# Patient Record
Sex: Female | Born: 1975 | Race: White | Hispanic: No | Marital: Married | Smoking: Never smoker
Health system: Southern US, Community
[De-identification: ages and names within clinical notes are randomized; demographics above are authoritative.]

## PROBLEM LIST (undated history)

## (undated) DIAGNOSIS — Z973 Presence of spectacles and contact lenses: Secondary | ICD-10-CM

## (undated) DIAGNOSIS — F329 Major depressive disorder, single episode, unspecified: Secondary | ICD-10-CM

## (undated) DIAGNOSIS — F419 Anxiety disorder, unspecified: Secondary | ICD-10-CM

## (undated) DIAGNOSIS — F411 Generalized anxiety disorder: Secondary | ICD-10-CM

## (undated) DIAGNOSIS — K219 Gastro-esophageal reflux disease without esophagitis: Secondary | ICD-10-CM

## (undated) HISTORY — PX: OVARIAN CYST REMOVAL: SHX89

## (undated) HISTORY — PX: OTHER SURGICAL HISTORY: SHX169

## (undated) HISTORY — PX: DILATION AND CURETTAGE OF UTERUS: SHX78

## (undated) HISTORY — PX: STRABISMUS SURGERY: SHX218

## (undated) HISTORY — PX: LEEP: SHX91

---

## 2004-08-18 ENCOUNTER — Other Ambulatory Visit: Admission: RE | Admit: 2004-08-18 | Discharge: 2004-08-18 | Payer: Self-pay | Admitting: Obstetrics and Gynecology

## 2004-12-18 HISTORY — PX: MICROTUBOPLASTY: SHX5401

## 2005-03-04 ENCOUNTER — Inpatient Hospital Stay (HOSPITAL_COMMUNITY): Admission: AD | Admit: 2005-03-04 | Discharge: 2005-03-04 | Payer: Self-pay | Admitting: Obstetrics and Gynecology

## 2005-04-05 ENCOUNTER — Other Ambulatory Visit: Admission: RE | Admit: 2005-04-05 | Discharge: 2005-04-05 | Payer: Self-pay | Admitting: Obstetrics and Gynecology

## 2005-04-09 ENCOUNTER — Encounter: Admission: RE | Admit: 2005-04-09 | Discharge: 2005-04-09 | Payer: Self-pay | Admitting: Obstetrics and Gynecology

## 2005-08-04 ENCOUNTER — Ambulatory Visit (HOSPITAL_COMMUNITY): Admission: RE | Admit: 2005-08-04 | Discharge: 2005-08-04 | Payer: Self-pay | Admitting: Obstetrics and Gynecology

## 2005-10-12 ENCOUNTER — Inpatient Hospital Stay (HOSPITAL_COMMUNITY): Admission: AD | Admit: 2005-10-12 | Discharge: 2005-10-14 | Payer: Self-pay | Admitting: Obstetrics and Gynecology

## 2005-10-20 ENCOUNTER — Ambulatory Visit (HOSPITAL_COMMUNITY): Admission: AD | Admit: 2005-10-20 | Discharge: 2005-10-20 | Payer: Self-pay | Admitting: Obstetrics and Gynecology

## 2010-02-08 ENCOUNTER — Encounter: Payer: Self-pay | Admitting: Family Medicine

## 2011-03-18 ENCOUNTER — Ambulatory Visit: Payer: Self-pay | Admitting: Family Medicine

## 2012-01-19 DIAGNOSIS — Z87410 Personal history of cervical dysplasia: Secondary | ICD-10-CM

## 2012-01-19 HISTORY — DX: Personal history of cervical dysplasia: Z87.410

## 2012-04-20 ENCOUNTER — Other Ambulatory Visit: Payer: Self-pay | Admitting: Obstetrics and Gynecology

## 2012-05-02 ENCOUNTER — Other Ambulatory Visit: Payer: Self-pay

## 2012-05-08 ENCOUNTER — Other Ambulatory Visit: Payer: Self-pay

## 2012-05-08 ENCOUNTER — Other Ambulatory Visit: Payer: Self-pay | Admitting: Obstetrics and Gynecology

## 2012-05-12 ENCOUNTER — Inpatient Hospital Stay (HOSPITAL_COMMUNITY)
Admission: AD | Admit: 2012-05-12 | Discharge: 2012-05-12 | Disposition: A | Discharge status: Home / Self Care | Payer: BC Managed Care – PPO | Source: Ambulatory Visit | Attending: Obstetrics and Gynecology | Admitting: Obstetrics and Gynecology

## 2012-05-12 ENCOUNTER — Encounter (HOSPITAL_COMMUNITY): Payer: Self-pay

## 2012-05-12 DIAGNOSIS — R109 Unspecified abdominal pain: Secondary | ICD-10-CM | POA: Insufficient documentation

## 2012-05-12 DIAGNOSIS — N938 Other specified abnormal uterine and vaginal bleeding: Secondary | ICD-10-CM | POA: Insufficient documentation

## 2012-05-12 DIAGNOSIS — Z3202 Encounter for pregnancy test, result negative: Secondary | ICD-10-CM | POA: Insufficient documentation

## 2012-05-12 DIAGNOSIS — N949 Unspecified condition associated with female genital organs and menstrual cycle: Secondary | ICD-10-CM | POA: Insufficient documentation

## 2012-05-12 DIAGNOSIS — N920 Excessive and frequent menstruation with regular cycle: Secondary | ICD-10-CM

## 2012-05-12 LAB — CBC
HCT: 36.9 % (ref 36.0–46.0)
Hemoglobin: 12.8 g/dL (ref 12.0–15.0)
MCHC: 34.7 g/dL (ref 30.0–36.0)
MCV: 91.3 fL (ref 78.0–100.0)
RDW: 12.3 % (ref 11.5–15.5)
WBC: 10.8 10*3/uL — ABNORMAL HIGH (ref 4.0–10.5)

## 2012-05-12 NOTE — MAU Provider Note (Signed)
History     CSN: [id number]  Arrival date and time: 05/12/12 1559   First Provider Initiated Contact with Patient 05/12/12 1739      Chief Complaint  Patient presents with  . Vaginal Bleeding  . Abdominal Pain   HPI 37 y.o. [id number] here with vaginal bleeding starting this morning. States bleeding started this morning, initially heavy with clots, has since slowed. Had a pain earlier when passing a clot, no pain now. Had Mirena removed on 4-2. Had colposcopy on Monday of this week, has LEEP planned. Last intercourse yesterday.   History reviewed. No pertinent past medical history.  Past Surgical History  Procedure Laterality Date  . Tubal reversal    . Dilation and curettage of uterus      No family history on file.  History  Substance Use Topics  . Smoking status: Never Smoker   . Smokeless tobacco: Not on file  . Alcohol Use: No    Allergies:  Allergies  Allergen Reactions  . Flagyl (Metronidazole) Hives and Nausea Only    Prescriptions prior to admission  Medication Sig Dispense Refill  . ibuprofen (ADVIL,MOTRIN) 200 MG tablet Take 600 mg by mouth every 6 (six) hours as needed for pain or headache.      . Prenatal Vit-Fe Fumarate-FA (PRENATAL MULTIVITAMIN) TABS Take 1 tablet by mouth daily at 12 noon.      . sertraline (ZOLOFT) 100 MG tablet Take 100 mg by mouth daily.        Review of Systems  Constitutional: Negative.   Respiratory: Negative.   Cardiovascular: Negative.   Gastrointestinal: Negative for nausea, vomiting, abdominal pain, diarrhea and constipation.  Genitourinary: Negative for dysuria, urgency, frequency, hematuria and flank pain.       Negative for  vaginal discharge, dyspareunia, + discharge   Musculoskeletal: Negative.   Neurological: Negative.   Psychiatric/Behavioral: Negative.    Physical Exam   Blood pressure 114/74, pulse 78, resp. rate 16, height 5\' 2"  (1.575 m), weight 140 lb (63.504 kg), SpO2 100.00%.  Physical Exam  Nursing  note and vitals reviewed. Constitutional: She is oriented to person, place, and time. She appears well-developed and well-nourished. No distress.  Cardiovascular: Normal rate.   Respiratory: Effort normal.  GI: Soft. There is no tenderness.  Genitourinary: There is no rash, tenderness or lesion on the right labia. There is no rash, tenderness or lesion on the left labia. Uterus is not enlarged and not tender. Cervix exhibits no motion tenderness, no discharge and no friability. Right adnexum displays no mass, no tenderness and no fullness. Left adnexum displays no mass, no tenderness and no fullness. There is bleeding (small) around the vagina.    Musculoskeletal: Normal range of motion.  Neurological: She is alert and oriented to person, place, and time.  Skin: Skin is warm and dry.  Psychiatric: She has a normal mood and affect.    MAU Course  Procedures  Results for orders placed during the hospital encounter of 05/12/12 (from the past 24 hour(s))  CBC     Status: Abnormal   Collection Time    05/12/12  4:41 PM      Result Value Range   WBC 10.8 (*) 4.0 - 10.5 K/uL   RBC 4.04  3.87 - 5.11 MIL/uL   Hemoglobin 12.8  12.0 - 15.0 g/dL   HCT 14.7  82.9 - 56.2 %   MCV 91.3  78.0 - 100.0 fL   MCH 31.7  26.0 - 34.0 pg  MCHC 34.7  30.0 - 36.0 g/dL   RDW 16.1  09.6 - 04.5 %   Platelets 202  150 - 400 K/uL  POCT PREGNANCY, URINE     Status: None   Collection Time    05/12/12  5:30 PM      Result Value Range   Preg Test, Ur NEGATIVE  NEGATIVE     Assessment and Plan  Vaginal bleeding - likely first menses after d/c of Mirena No active heavy bleeding - pt to call office if heavy bleeding resumes to discuss rx for progestin Precautions rev'd  FRAZIER,NATALIE 05/12/2012, 5:44 PM

## 2012-05-12 NOTE — MAU Note (Signed)
Patient states she had a Mirena taken out on 4-2. Had a colposcopy on 4-21 and started bleeding this morning. Has had some cramping and then will pass large clots.

## 2012-05-25 ENCOUNTER — Other Ambulatory Visit: Payer: Self-pay | Admitting: Obstetrics and Gynecology

## 2012-05-25 HISTORY — PX: CERVICAL BIOPSY  W/ LOOP ELECTRODE EXCISION: SUR135

## 2012-12-01 ENCOUNTER — Other Ambulatory Visit: Payer: Self-pay | Admitting: Obstetrics and Gynecology

## 2012-12-01 HISTORY — PX: LAPAROSCOPIC OVARIAN CYSTECTOMY: SUR786

## 2013-02-24 ENCOUNTER — Inpatient Hospital Stay (HOSPITAL_COMMUNITY)
Admission: AD | Admit: 2013-02-24 | Discharge: 2013-02-24 | Disposition: A | Discharge status: Home / Self Care | Payer: BC Managed Care – PPO | Source: Ambulatory Visit | Attending: Obstetrics and Gynecology | Admitting: Obstetrics and Gynecology

## 2013-02-24 ENCOUNTER — Inpatient Hospital Stay (HOSPITAL_COMMUNITY): Payer: BC Managed Care – PPO

## 2013-02-24 ENCOUNTER — Encounter (HOSPITAL_COMMUNITY): Payer: Self-pay | Admitting: *Deleted

## 2013-02-24 DIAGNOSIS — O209 Hemorrhage in early pregnancy, unspecified: Secondary | ICD-10-CM | POA: Insufficient documentation

## 2013-02-24 DIAGNOSIS — Z6791 Unspecified blood type, Rh negative: Secondary | ICD-10-CM | POA: Diagnosis present

## 2013-02-24 DIAGNOSIS — F411 Generalized anxiety disorder: Secondary | ICD-10-CM | POA: Insufficient documentation

## 2013-02-24 DIAGNOSIS — O200 Threatened abortion: Secondary | ICD-10-CM

## 2013-02-24 HISTORY — DX: Anxiety disorder, unspecified: F41.9

## 2013-02-24 LAB — CBC
HCT: 34.1 % — ABNORMAL LOW (ref 36.0–46.0)
Hemoglobin: 11.8 g/dL — ABNORMAL LOW (ref 12.0–15.0)
MCH: 30.9 pg (ref 26.0–34.0)
MCHC: 34.6 g/dL (ref 30.0–36.0)
MCV: 89.3 fL (ref 78.0–100.0)
PLATELETS: 215 10*3/uL (ref 150–400)
RBC: 3.82 MIL/uL — ABNORMAL LOW (ref 3.87–5.11)
RDW: 12.7 % (ref 11.5–15.5)
WBC: 11.4 10*3/uL — ABNORMAL HIGH (ref 4.0–10.5)

## 2013-02-24 LAB — URINE MICROSCOPIC-ADD ON

## 2013-02-24 LAB — URINALYSIS, ROUTINE W REFLEX MICROSCOPIC
BILIRUBIN URINE: NEGATIVE
Glucose, UA: NEGATIVE mg/dL
KETONES UR: NEGATIVE mg/dL
LEUKOCYTES UA: NEGATIVE
NITRITE: NEGATIVE
Protein, ur: NEGATIVE mg/dL
SPECIFIC GRAVITY, URINE: 1.02 (ref 1.005–1.030)
UROBILINOGEN UA: 0.2 mg/dL (ref 0.0–1.0)
pH: 6 (ref 5.0–8.0)

## 2013-02-24 LAB — WET PREP, GENITAL
CLUE CELLS WET PREP: NONE SEEN
Trich, Wet Prep: NONE SEEN
Yeast Wet Prep HPF POC: NONE SEEN

## 2013-02-24 LAB — HCG, QUANTITATIVE, PREGNANCY: HCG, BETA CHAIN, QUANT, S: 59742 m[IU]/mL — AB (ref <5)

## 2013-02-24 LAB — ABO/RH: ABO/RH(D): O NEG

## 2013-02-24 MED ORDER — RHO D IMMUNE GLOBULIN 1500 UNIT/2ML IJ SOLN
300.0000 ug | Freq: Once | INTRAMUSCULAR | Status: AC
  Administered 2013-02-24: 300 ug via INTRAMUSCULAR
  Filled 2013-02-24: qty 2

## 2013-02-24 NOTE — Discharge Instructions (Signed)
Threatened Miscarriage  A threatened miscarriage is a pregnancy that may end. It may be marked by bleeding during the first 20 weeks of pregnancy. Often, the pregnancy can continue without any more problems. You may be asked to stop:  Having sex (intercourse).  Having orgasms.  Using tampons.  Exercising.  Doing heavy physical activity and work. HOME CARE   Your doctor may tell you to take bed rest and to stop activities and work.  Write down the number of pads you use each day. Write down how often you change pads. Write down how soaked they are.  Follow your doctor's advice for follow-up visits and tests.  If your blood type is Rh-negative and the father's blood is Rh-positive (or is not known), you may get a shot to protect the baby.  If you have a miscarriage, save all the tissue you pass in a container. Take the container to your doctor. GET HELP RIGHT AWAY IF:   You have bad cramps or pain in your belly (abdomen), lower belly, or back.  You have a fever or chills.  Your bleeding gets worse or you pass large clots of blood or tissue. Save this tissue to show your doctor.  You feel lightheaded, weak, dizzy, or pass out (faint).  You have a gush of fluid from your vagina. MAKE SURE YOU:   Understand these instructions.  Will watch your condition.  Will get help right away if you are not doing well or get worse. Document Released: 12/18/2007 Document Revised: 03/29/2011 Document Reviewed: 01/20/2009 [facility] Patient Information 2014 [place], [place].  Pelvic Rest Pelvic rest is sometimes recommended for women when:   The placenta is partially or completely covering the opening of the cervix (placenta previa).  There is bleeding between the uterine wall and the amniotic sac in the first trimester (subchorionic hemorrhage).  The cervix begins to open without labor starting (incompetent cervix, cervical insufficiency).  The labor is too early (preterm labor). HOME  CARE INSTRUCTIONS  Do not have sexual intercourse, stimulation, or an orgasm.  Do not use tampons, douche, or put anything in the vagina.  Do not lift anything over 10 pounds (4.5 kg).  Avoid strenuous activity or straining your pelvic muscles. SEEK MEDICAL CARE IF:  You have any vaginal bleeding during pregnancy. Treat this as a potential emergency.  You have cramping pain felt low in the stomach (stronger than menstrual cramps).  You notice vaginal discharge (watery, mucus, or bloody).  You have a low, dull backache.  There are regular contractions or uterine tightening. SEEK IMMEDIATE MEDICAL CARE IF: You have vaginal bleeding and have placenta previa.  Document Released: 05/01/2010 Document Revised: 03/29/2011 Document Reviewed: 05/01/2010 [facility] Patient Information 2014 [place], [place]. Rh Incompatibility Rh incompatibility is a condition that occurs during pregnancy if a woman has Rh-negative blood and her baby has Rh-positive blood. "Rh-negative" and "Rh-positive" refer to whether or not the blood has an Rh factor. An Rh factor is a specific protein found on the surface of red blood cells. If a woman has Rh factor, she is Rh-positive. If she does not have an Rh factor, she is Rh-negative. Having or not having an Rh factor does not affect the mother's general health. However, it can cause problems during pregnancy.  WHAT KIND OF PROBLEMS CAN Rh INCOMPATIBILITY CAUSE? During pregnancy, blood from the baby can cross into the mother's bloodstream, especially during delivery. If a mother is Rh-negative and the baby is Rh-positive, the mother's defense system will react  to the baby's blood as if it was a foreign substance and will create proteins (antibodies). This is called sensitization. Once the mother is sensitized, her Rh antibodies will cross the placenta to the baby and attack the baby's Rh-positive blood as if it is a harmful substance.  Rh incompatibility can also happen if  the Rh-negative pregnant woman is exposed to the Rh factor during a blood transfusion with Rh-positive blood.  HOW DOES THIS CONDITION AFFECT MY BABY? The Rh antibodies that attack and destroy the baby's red blood cells can lead to hemolytic disease in the baby. Hemolytic disease is when the red blood cells break down. This can cause:   Yellowing of the skin and eyes (jaundice).  The body to not have enough healthy red blood cells (anemia).   Brain damage.   Heart failure.   Death.  These antibodies usually do not cause problems during a first pregnancy. This is because the blood from the baby often times crosses into the mother's bloodstream during delivery, and the baby is born before many of the antibodies can develop. However, the antibodies stay in your body once they have formed. Because of this, Rh incompatibility is more likely to cause problems in second or later pregnancies (if the baby is Rh-positive).  HOW IS THIS CONDITION DIAGNOSED? When a woman becomes pregnant, blood tests may be done to find out her blood type and Rh factor. If the woman is Rh-negative, she also may have another blood test called an antibody screen. The antibody screen shows whether she has Rh antibodies in her blood. If she does, it means she was exposed to Rh-positive blood before, and she is at risk for Rh incompatibility.  To find out whether the baby is developing hemolytic anemia and how serious it is, caregivers may use more advanced tests, such as ultrasonography (commonly known as ultrasound).  HOW IS Rh INCOMPATIBILITY TREATED?  Rh incompatibility is treated with a shot of medicine called Rho(D) immune globulin. This medicine keeps the woman's body from making antibodies that can cause serious problems in the baby or future babies.  Two shots will be given, one at around your seventh month of pregnancy and the other within 72 hours of your baby being born. If you are Rh-negative, you will need this  medicine every time you have a baby with Rh-positive blood. If you already have antibodies in your blood, anti-D immune globulin will not help. Your doctor will not give you this medicine, but will watch your pregnancy closely for problems instead.  This shot may also be given to an Rh-negative woman when the risk of blood transfer between the mom and baby is high. The risk is high with:   An amniocentesis.   A miscarriage or an abortion.   An ectopic pregnancy.   Any vaginal bleeding during pregnancy.  Document Released: 06/26/2001 Document Revised: 09/06/2012 Document Reviewed: 04/18/2012 [facility] Patient Information 2014 [place], [place].

## 2013-02-24 NOTE — MAU Note (Signed)
Pt states here for bleeding when wiping noted since yesterday. Having mild pain. Had yest infection and MD called in rx for patient. Unsure if yeast inf. Is gone. Denies uti s/s.

## 2013-02-24 NOTE — MAU Note (Signed)
Pt. Here for spotting. Did have a yeast infection Wednesday and was treated for that. Thursday pt. Had pain in right side of abdomen. Did have spotting yesterday and again today. Noticed more when wiped.

## 2013-02-24 NOTE — MAU Provider Note (Signed)
History     CSN: [id number]  Arrival date and time: 02/24/13 1059   First Provider Initiated Contact with Patient 02/24/13 1336      Chief Complaint  Patient presents with  . Vaginal Bleeding   HPI Comments: Tanya Miller 38 y.o. [id number] presents to MAU with spotting and right sided abdominal pains for 2 days. She has not taken any medications.  She has intercourse yesterday. She was diagnosed with yeast infection few days ago and given cream. She is patient of Tanya Miller.   Vaginal Bleeding      Past Medical History  Diagnosis Date  . Anxiety     Past Surgical History  Procedure Laterality Date  . Tubal reversal    . Dilation and curettage of uterus      april 2014  . Dilation and curettage of uterus      sept 2014/had procedure on tubes    History reviewed. No pertinent family history.  History  Substance Use Topics  . Smoking status: Never Smoker   . Smokeless tobacco: Not on file  . Alcohol Use: No    Allergies:  Allergies  Allergen Reactions  . Flagyl [Metronidazole] Hives and Nausea Only    Prescriptions prior to admission  Medication Sig Dispense Refill  . docusate sodium (COLACE) 100 MG capsule Take 100 mg by mouth daily.      . Prenatal Vit-Fe Fumarate-FA (PRENATAL MULTIVITAMIN) TABS Take 1 tablet by mouth daily at 12 noon.      . sertraline (ZOLOFT) 100 MG tablet Take 100 mg by mouth daily.        Review of Systems  Constitutional: Negative.   HENT: Negative.   Eyes: Negative.   Respiratory: Negative.   Cardiovascular: Negative.   Gastrointestinal: Negative.   Genitourinary:       Vaginal bleeding  Musculoskeletal: Negative.   Skin: Negative.   Neurological: Negative.   Endo/Heme/Allergies: Negative.   Psychiatric/Behavioral: Negative.    Physical Exam   Blood pressure 103/68, pulse 73, temperature 98.4 F (36.9 C), temperature source Oral, resp. rate 16, height 5\' 3"  (1.6 m), weight 66.225 kg (146 lb), last menstrual period  12/26/2012, SpO2 100.00%.  Physical Exam  Constitutional: She is oriented to person, place, and time. She appears well-developed and well-nourished. No distress.  HENT:  Head: Normocephalic and atraumatic.  Eyes: Pupils are equal, round, and reactive to light.  Cardiovascular: Normal rate, regular rhythm and normal heart sounds.   Respiratory: Effort normal and breath sounds normal.  GI: Soft. Bowel sounds are normal.  Genitourinary:  Genital:external negative Vaginal:very small amount of blood streaked mucous Cervix:closed Bimanual:negative   Neurological: She is alert and oriented to person, place, and time.  Skin: Skin is warm and dry.  Psychiatric: She has a normal mood and affect. Her behavior is normal. Judgment and thought content normal.   Results for orders placed during the hospital encounter of 02/24/13 (from the past 24 hour(s))  URINALYSIS, ROUTINE W REFLEX MICROSCOPIC     Status: Abnormal   Collection Time    02/24/13 12:07 PM      Result Value Range   Color, Urine YELLOW  YELLOW   APPearance CLEAR  CLEAR   Specific Gravity, Urine 1.020  1.005 - 1.030   pH 6.0  5.0 - 8.0   Glucose, UA NEGATIVE  NEGATIVE mg/dL   Hgb urine dipstick TRACE (*) NEGATIVE   Bilirubin Urine NEGATIVE  NEGATIVE   Ketones, ur NEGATIVE  NEGATIVE mg/dL  Protein, ur NEGATIVE  NEGATIVE mg/dL   Urobilinogen, UA 0.2  0.0 - 1.0 mg/dL   Nitrite NEGATIVE  NEGATIVE   Leukocytes, UA NEGATIVE  NEGATIVE  URINE MICROSCOPIC-ADD ON     Status: Abnormal   Collection Time    02/24/13 12:07 PM      Result Value Range   Squamous Epithelial / LPF FEW (*) RARE   WBC, UA 0-2  <3 WBC/hpf   RBC / HPF 0-2  <3 RBC/hpf   Bacteria, UA FEW (*) RARE  CBC     Status: Abnormal   Collection Time    02/24/13 12:11 PM      Result Value Range   WBC 11.4 (*) 4.0 - 10.5 K/uL   RBC 3.82 (*) 3.87 - 5.11 MIL/uL   Hemoglobin 11.8 (*) 12.0 - 15.0 g/dL   HCT 47.834.1 (*) 29.536.0 - 62.146.0 %   MCV 89.3  78.0 - 100.0 fL   MCH 30.9   26.0 - 34.0 pg   MCHC 34.6  30.0 - 36.0 g/dL   RDW 30.812.7  65.711.5 - 84.615.5 %   Platelets 215  150 - 400 K/uL  ABO/RH     Status: None   Collection Time    02/24/13 12:11 PM      Result Value Range   ABO/RH(D) O NEG    HCG, QUANTITATIVE, PREGNANCY     Status: Abnormal   Collection Time    02/24/13 12:20 PM      Result Value Range   hCG, Beta Chain, Quant, [place] [id number] (*) <5 mIU/mL  WET PREP, GENITAL     Status: Abnormal   Collection Time    02/24/13  1:34 PM      Result Value Range   Yeast Wet Prep HPF POC NONE SEEN  NONE SEEN   Trich, Wet Prep NONE SEEN  NONE SEEN   Clue Cells Wet Prep HPF POC NONE SEEN  NONE SEEN   WBC, Wet Prep HPF POC FEW (*) NONE SEEN   [place] Ob Comp Less 14 Wks  02/24/2013   CLINICAL DATA:  Bleeding.  EXAM: OBSTETRIC <14 WK ULTRASOUND  TECHNIQUE: Transabdominal ultrasound was performed for evaluation of the gestation as well as the maternal uterus and adnexal regions.  COMPARISON:  None.  FINDINGS: Intrauterine gestational sac: Visualized/normal in shape.  Yolk sac:  Present  Embryo:  Present  Cardiac Activity: Present  Heart Rate: 174 bpm  CRL:   23  mm   9 w 0 d                  US EDC: 09/29/2013  Maternal uterus/adnexae: Right ovary measures 3.4 x 1.5 x 2.1 cm. Left ovary measures 2.5 x 1.7 x 2.2 cm. No evidence for free fluid.  IMPRESSION: Single live intrauterine pregnancy. Calculated gestational age is 9 weeks, 0 days.   Electronically Signed   By: Richarda OverlieAdam  Miller M.D.   On: 02/24/2013 13:28     MAU Course  Procedures  MDM  CBC, U/S, Quant, ABORh Wet Prep/ GC/Chlamydia Called Tanya Marcelle OverlieHolland with update on patient status  Assessment and Plan   A: Bleeding in early pregnancy Negative blood type/ Rhogam IM now Miscarriage precautions   Carolynn ServeBarefoot, Tanya Miller 02/24/2013, 2:49 PM

## 2013-02-25 LAB — RH IG WORKUP (INCLUDES ABO/RH)
ABO/RH(D): O NEG
Antibody Screen: NEGATIVE
GESTATIONAL AGE(WKS): 9
Unit division: 0

## 2013-02-27 LAB — OB RESULTS CONSOLE RUBELLA ANTIBODY, IGM: RUBELLA: IMMUNE

## 2013-02-27 LAB — OB RESULTS CONSOLE HIV ANTIBODY (ROUTINE TESTING): HIV: NONREACTIVE

## 2013-02-27 LAB — OB RESULTS CONSOLE HEPATITIS B SURFACE ANTIGEN: Hepatitis B Surface Ag: NEGATIVE

## 2013-02-27 LAB — OB RESULTS CONSOLE ABO/RH: RH Type: NEGATIVE

## 2013-02-27 LAB — OB RESULTS CONSOLE GC/CHLAMYDIA
CHLAMYDIA, DNA PROBE: NEGATIVE
GC PROBE AMP, GENITAL: NEGATIVE

## 2013-02-27 LAB — OB RESULTS CONSOLE RPR: RPR: NONREACTIVE

## 2013-03-01 ENCOUNTER — Inpatient Hospital Stay (HOSPITAL_COMMUNITY)
Admission: EM | Admit: 2013-03-01 | Discharge: 2013-03-01 | Disposition: A | Discharge status: Home / Self Care | Payer: BC Managed Care – PPO | Attending: Obstetrics and Gynecology | Admitting: Obstetrics and Gynecology

## 2013-08-18 ENCOUNTER — Inpatient Hospital Stay (HOSPITAL_COMMUNITY)
Admission: AD | Admit: 2013-08-18 | Payer: BC Managed Care – PPO | Source: Ambulatory Visit | Admitting: Obstetrics and Gynecology

## 2013-09-03 LAB — OB RESULTS CONSOLE GBS: STREP GROUP B AG: NEGATIVE

## 2013-09-21 ENCOUNTER — Inpatient Hospital Stay (HOSPITAL_COMMUNITY)
Admission: AD | Admit: 2013-09-21 | Discharge: 2013-09-22 | Disposition: A | Discharge status: Home / Self Care | Payer: BC Managed Care – PPO | Source: Ambulatory Visit | Attending: Obstetrics & Gynecology | Admitting: Obstetrics & Gynecology

## 2013-09-21 ENCOUNTER — Encounter (HOSPITAL_COMMUNITY): Payer: Self-pay | Admitting: *Deleted

## 2013-09-21 DIAGNOSIS — O479 False labor, unspecified: Secondary | ICD-10-CM | POA: Insufficient documentation

## 2013-09-21 NOTE — MAU Note (Signed)
Contractions all day. Stronger since 1900. 2-3cm and 80-90effaced on TUes. Denies LOF or bleeding

## 2013-09-21 NOTE — MAU Note (Signed)
PT  SAYS SHE STARTED  HURTING BAD AT 7PM.     VE  IN OFFICE  ON Tuesday-  2-3 CM.    DENIES HSV AND MRSA.     GBS- NEG.

## 2013-09-22 DIAGNOSIS — O479 False labor, unspecified: Secondary | ICD-10-CM | POA: Diagnosis not present

## 2013-09-25 ENCOUNTER — Inpatient Hospital Stay (HOSPITAL_COMMUNITY): Payer: BC Managed Care – PPO | Admitting: Anesthesiology

## 2013-09-25 ENCOUNTER — Encounter (HOSPITAL_COMMUNITY): Payer: Self-pay | Admitting: *Deleted

## 2013-09-25 ENCOUNTER — Encounter (HOSPITAL_COMMUNITY): Payer: BC Managed Care – PPO | Admitting: Anesthesiology

## 2013-09-25 ENCOUNTER — Inpatient Hospital Stay (HOSPITAL_COMMUNITY)
Admission: AD | Admit: 2013-09-25 | Discharge: 2013-09-27 | DRG: 775 | Disposition: A | Discharge status: Home / Self Care | Payer: BC Managed Care – PPO | Source: Ambulatory Visit | Attending: Obstetrics and Gynecology | Admitting: Obstetrics and Gynecology

## 2013-09-25 DIAGNOSIS — IMO0001 Reserved for inherently not codable concepts without codable children: Secondary | ICD-10-CM

## 2013-09-25 DIAGNOSIS — O09529 Supervision of elderly multigravida, unspecified trimester: Secondary | ICD-10-CM | POA: Diagnosis present

## 2013-09-25 DIAGNOSIS — O479 False labor, unspecified: Secondary | ICD-10-CM | POA: Diagnosis present

## 2013-09-25 LAB — CBC
HCT: 33.4 % — ABNORMAL LOW (ref 36.0–46.0)
Hemoglobin: 11.1 g/dL — ABNORMAL LOW (ref 12.0–15.0)
MCH: 30.7 pg (ref 26.0–34.0)
MCHC: 33.2 g/dL (ref 30.0–36.0)
MCV: 92.5 fL (ref 78.0–100.0)
Platelets: 177 10*3/uL (ref 150–400)
RBC: 3.61 MIL/uL — ABNORMAL LOW (ref 3.87–5.11)
RDW: 14.6 % (ref 11.5–15.5)
WBC: 12.2 10*3/uL — ABNORMAL HIGH (ref 4.0–10.5)

## 2013-09-25 LAB — RPR

## 2013-09-25 MED ORDER — FENTANYL 2.5 MCG/ML BUPIVACAINE 1/10 % EPIDURAL INFUSION (WH - ANES)
14.0000 mL/h | INTRAMUSCULAR | Status: DC | PRN
  Administered 2013-09-25: 14 mL/h via EPIDURAL

## 2013-09-25 MED ORDER — OXYTOCIN BOLUS FROM INFUSION: 500.0000 mL | INTRAVENOUS | Status: DC

## 2013-09-25 MED ORDER — OXYCODONE-ACETAMINOPHEN 5-325 MG PO TABS: 1.0000 | ORAL_TABLET | ORAL | Status: DC | PRN

## 2013-09-25 MED ORDER — EPHEDRINE 5 MG/ML INJ
10.0000 mg | INTRAVENOUS | Status: DC | PRN
  Filled 2013-09-25: qty 2

## 2013-09-25 MED ORDER — IBUPROFEN 600 MG PO TABS
600.0000 mg | ORAL_TABLET | Freq: Four times a day (QID) | ORAL | Status: DC
  Administered 2013-09-25 – 2013-09-27 (×8): 600 mg via ORAL
  Filled 2013-09-25 (×8): qty 1

## 2013-09-25 MED ORDER — ACETAMINOPHEN 325 MG PO TABS: 650.0000 mg | ORAL_TABLET | ORAL | Status: DC | PRN

## 2013-09-25 MED ORDER — WITCH HAZEL-GLYCERIN EX PADS: 1.0000 "application " | MEDICATED_PAD | CUTANEOUS | Status: DC | PRN

## 2013-09-25 MED ORDER — PHENYLEPHRINE 40 MCG/ML (10ML) SYRINGE FOR IV PUSH (FOR BLOOD PRESSURE SUPPORT)
80.0000 ug | PREFILLED_SYRINGE | INTRAVENOUS | Status: DC | PRN
  Filled 2013-09-25: qty 2

## 2013-09-25 MED ORDER — LACTATED RINGERS IV SOLN
INTRAVENOUS | Status: DC
  Administered 2013-09-25: 11:00:00 via INTRAVENOUS

## 2013-09-25 MED ORDER — LIDOCAINE HCL (PF) 1 % IJ SOLN
30.0000 mL | INTRAMUSCULAR | Status: DC | PRN
  Filled 2013-09-25: qty 30

## 2013-09-25 MED ORDER — DIPHENHYDRAMINE HCL 50 MG/ML IJ SOLN: 12.5000 mg | INTRAMUSCULAR | Status: DC | PRN

## 2013-09-25 MED ORDER — LANOLIN HYDROUS EX OINT: TOPICAL_OINTMENT | CUTANEOUS | Status: DC | PRN

## 2013-09-25 MED ORDER — FENTANYL 2.5 MCG/ML BUPIVACAINE 1/10 % EPIDURAL INFUSION (WH - ANES)
INTRAMUSCULAR | Status: AC
  Filled 2013-09-25: qty 125

## 2013-09-25 MED ORDER — ONDANSETRON HCL 4 MG/2ML IJ SOLN: 4.0000 mg | Freq: Four times a day (QID) | INTRAMUSCULAR | Status: DC | PRN

## 2013-09-25 MED ORDER — BISACODYL 10 MG RE SUPP: 10.0000 mg | Freq: Every day | RECTAL | Status: DC | PRN

## 2013-09-25 MED ORDER — DIBUCAINE 1 % RE OINT: 1.0000 "application " | TOPICAL_OINTMENT | RECTAL | Status: DC | PRN

## 2013-09-25 MED ORDER — OXYCODONE-ACETAMINOPHEN 5-325 MG PO TABS: 2.0000 | ORAL_TABLET | ORAL | Status: DC | PRN

## 2013-09-25 MED ORDER — FLEET ENEMA 7-19 GM/118ML RE ENEM: 1.0000 | ENEMA | Freq: Every day | RECTAL | Status: DC | PRN

## 2013-09-25 MED ORDER — SENNOSIDES-DOCUSATE SODIUM 8.6-50 MG PO TABS
2.0000 | ORAL_TABLET | ORAL | Status: DC
  Administered 2013-09-26 – 2013-09-27 (×2): 2 via ORAL
  Filled 2013-09-25 (×2): qty 2

## 2013-09-25 MED ORDER — SERTRALINE HCL 100 MG PO TABS
100.0000 mg | ORAL_TABLET | Freq: Every day | ORAL | Status: DC
  Administered 2013-09-26: 100 mg via ORAL
  Filled 2013-09-25 (×2): qty 1

## 2013-09-25 MED ORDER — OXYTOCIN 40 UNITS IN LACTATED RINGERS INFUSION - SIMPLE MED
62.5000 mL/h | INTRAVENOUS | Status: DC
  Administered 2013-09-25: 62.5 mL/h via INTRAVENOUS
  Filled 2013-09-25: qty 1000

## 2013-09-25 MED ORDER — CITRIC ACID-SODIUM CITRATE 334-500 MG/5ML PO SOLN: 30.0000 mL | ORAL | Status: DC | PRN

## 2013-09-25 MED ORDER — ONDANSETRON HCL 4 MG PO TABS: 4.0000 mg | ORAL_TABLET | ORAL | Status: DC | PRN

## 2013-09-25 MED ORDER — TETANUS-DIPHTH-ACELL PERTUSSIS 5-2.5-18.5 LF-MCG/0.5 IM SUSP
0.5000 mL | Freq: Once | INTRAMUSCULAR | Status: DC
Start: 2013-09-26 — End: 2013-09-27

## 2013-09-25 MED ORDER — ZOLPIDEM TARTRATE 5 MG PO TABS: 5.0000 mg | ORAL_TABLET | Freq: Every evening | ORAL | Status: DC | PRN

## 2013-09-25 MED ORDER — LACTATED RINGERS IV SOLN: 500.0000 mL | Freq: Once | INTRAVENOUS | Status: DC

## 2013-09-25 MED ORDER — BENZOCAINE-MENTHOL 20-0.5 % EX AERO
1.0000 "application " | INHALATION_SPRAY | CUTANEOUS | Status: DC | PRN
  Filled 2013-09-25: qty 56

## 2013-09-25 MED ORDER — LIDOCAINE HCL (PF) 1 % IJ SOLN
INTRAMUSCULAR | Status: DC | PRN
  Administered 2013-09-25 (×2): 8 mL

## 2013-09-25 MED ORDER — DIPHENHYDRAMINE HCL 25 MG PO CAPS: 25.0000 mg | ORAL_CAPSULE | Freq: Four times a day (QID) | ORAL | Status: DC | PRN

## 2013-09-25 MED ORDER — LACTATED RINGERS IV SOLN: 500.0000 mL | INTRAVENOUS | Status: DC | PRN

## 2013-09-25 MED ORDER — ONDANSETRON HCL 4 MG/2ML IJ SOLN: 4.0000 mg | INTRAMUSCULAR | Status: DC | PRN

## 2013-09-25 MED ORDER — SIMETHICONE 80 MG PO CHEW: 80.0000 mg | CHEWABLE_TABLET | ORAL | Status: DC | PRN

## 2013-09-25 MED ORDER — PRENATAL MULTIVITAMIN CH
1.0000 | ORAL_TABLET | Freq: Every day | ORAL | Status: DC
  Administered 2013-09-26 – 2013-09-27 (×2): 1 via ORAL
  Filled 2013-09-25 (×2): qty 1

## 2013-09-25 MED ORDER — FENTANYL 2.5 MCG/ML BUPIVACAINE 1/10 % EPIDURAL INFUSION (WH - ANES)
INTRAMUSCULAR | Status: DC | PRN
  Administered 2013-09-25: 14 mL/h via EPIDURAL

## 2013-09-25 MED ORDER — PHENYLEPHRINE 40 MCG/ML (10ML) SYRINGE FOR IV PUSH (FOR BLOOD PRESSURE SUPPORT)
PREFILLED_SYRINGE | INTRAVENOUS | Status: AC
  Filled 2013-09-25: qty 10

## 2013-09-25 NOTE — Anesthesia Preprocedure Evaluation (Signed)
Anesthesia Evaluation  Patient identified by MRN, date of birth, ID band Patient awake    Reviewed: Allergy & Precautions, H&P , NPO status , Patient's Chart, lab work & pertinent test results  Airway Mallampati: I  TM Distance: >3 FB Neck ROM: full    Dental no notable dental hx.    Pulmonary neg pulmonary ROS,    Pulmonary exam normal       Cardiovascular negative cardio ROS      Neuro/Psych negative neurological ROS     GI/Hepatic negative GI ROS, Neg liver ROS,   Endo/Other  negative endocrine ROS  Renal/GU negative Renal ROS     Musculoskeletal   Abdominal Normal abdominal exam  (+)   Peds  Hematology negative hematology ROS (+)   Anesthesia Other Findings   Reproductive/Obstetrics (+) Pregnancy                             Anesthesia Physical Anesthesia Plan  ASA: II  Anesthesia Plan: Epidural   Post-op Pain Management:    Induction:   Airway Management Planned:   Additional Equipment:   Intra-op Plan:   Post-operative Plan:   Informed Consent: I have reviewed the patients History and Physical, chart, labs and discussed the procedure including the risks, benefits and alternatives for the proposed anesthesia with the patient or authorized representative who has indicated his/her understanding and acceptance.     Plan Discussed with:   Anesthesia Plan Comments:         Anesthesia Quick Evaluation  

## 2013-09-25 NOTE — Anesthesia Procedure Notes (Signed)
Epidural Patient location during procedure: OB Start time: 09/25/2013 12:06 PM End time: 09/25/2013 12:10 PM  Staffing Anesthesiologist: Leilani Able Performed by: anesthesiologist   Preanesthetic Checklist Completed: patient identified, surgical consent, pre-op evaluation, timeout performed, IV checked, risks and benefits discussed and monitors and equipment checked  Epidural Patient position: sitting Prep: site prepped and draped and DuraPrep Patient monitoring: continuous pulse ox and blood pressure Approach: midline Location: L3-L4 Injection technique: LOR air  Needle:  Needle type: Tuohy  Needle gauge: 17 G Needle length: 9 cm and 9 Needle insertion depth: 4 cm Catheter type: closed end flexible Catheter size: 19 Gauge Catheter at skin depth: 9 cm Test dose: negative and Other  Assessment Sensory level: T9 Events: blood not aspirated, injection not painful, no injection resistance, negative IV test and no paresthesia  Additional Notes Reason for block:procedure for pain

## 2013-09-25 NOTE — Progress Notes (Signed)
Delivery Note At 2:10 PM a viable female was delivered via  (Presentation:ROA ;  ).  APGAR:7 ,9 ; weight pending.   Placenta status: Intact, Spontaneous.  Cord: 3 vessels with the following complications:none .  Cord pH: pending Nuchal cord x 2 Anesthesia: Epidural  Episiotomy: none Lacerations: small second degree midline, repaired Suture Repair: 2.0 vicryl rapide Est. Blood Loss (mL): 200  Mom to postpartum.  Baby to Couplet care / Skin to Skin.  Shayle Donahoo II,Kelley Polinsky E 09/25/2013, 2:24 PM

## 2013-09-25 NOTE — Lactation Note (Signed)
This note was copied from the chart of Tanya Miller. Lactation Consultation Note  Patient Name: Tanya Attallah Ontko [id number] Date: 09/25/2013 Reason for consult: Initial assessment of this experienced breastfeeding mother and her 5th baby, now 7 hours postpartum.  Baby is asleep and STS and mom says she recently attempted to breastfeed but baby too sleepy to latch.  Mom is experienced multipara.  She breastfed her last 2 children for 15 months and 9 months respectively but did not nurse 2 oldest children.  Her eldest daughter is at bedside and 47 yo.  Mom says she knows how to hand express her colostrum.  LC encouraged frequent STS and cue feedings. LC encouraged review of Baby and Me pp 9, 14 and 20-25 for STS and BF information. LC provided [employer] brochure and reviewed [facility] services and list of community and web site resources.     Maternal Data Formula Feeding for Exclusion: No Has patient been taught Hand Expression?: Yes (experienced mom; states she knows how to hand express) Does the patient have breastfeeding experience prior to this delivery?: Yes  Feeding    LATCH Score/Interventions         Initial LATCH score=8 per RN assessment             Lactation Tools Discussed/Used   STS, cue feedings, hand expression  Consult Status Consult Status: Follow-up Date: 09/26/13 Follow-up type: In-patient    Warrick Parisian [facility] 09/25/2013, 9:39 PM

## 2013-09-25 NOTE — H&P (Signed)
Tanya Miller is a 38 y.o. female presenting for UCs. No ROM, no HA, no vision change, no epigastric pain. Maternal Medical History:  Reason for admission: Contractions.   Contractions: Onset was 3-5 hours ago.    Fetal activity: Perceived fetal activity is normal.      OB History   Grav Para Term Preterm Abortions TAB SAB Ect Mult Living   Past Medical History  Diagnosis Date  . Anxiety    Past Surgical History  Procedure Laterality Date  . Tubal reversal    . Dilation and curettage of uterus      april 2014  . Dilation and curettage of uterus      sept 2014/had procedure on tubes  . Leep    . Ovarian cyst removal      left ovary   Family History: family history is not on file. Social History:  reports that she has never smoked. She does not have any smokeless tobacco history on file. She reports that she does not drink alcohol or use illicit drugs.   Prenatal Transfer Tool  Maternal Diabetes: No Genetic Screening: Normal Maternal Ultrasounds/Referrals: Normal Fetal Ultrasounds or other Referrals:  None Maternal Substance Abuse:  No Significant Maternal Medications:  None Significant Maternal Lab Results:  None Other Comments:  None  Review of Systems  Eyes: Negative for blurred vision.  Gastrointestinal: Negative for abdominal pain.  Neurological: Negative for headaches.    Dilation: 4 (scar tissue noted on cervix) Effacement (%): 90 Station: -2 Exam by:: L. Cresenzo, RN  Blood pressure 88/56, pulse 86, temperature 98 F (36.7 C), temperature source Oral, resp. rate 20, last menstrual period 12/26/2012, SpO2 98.00%, unknown if currently breastfeeding. Maternal Exam:  Uterine Assessment: Contraction strength is firm.  Contraction frequency is regular.   Abdomen: Patient reports no abdominal tenderness. Fetal presentation: vertex     Fetal Exam Fetal State Assessment: Category I - tracings are normal.     Physical Exam   Cardiovascular: Normal rate.   Respiratory: Effort normal.  Neurological: She has normal reflexes.  AROM light meconium Cx C/C/0 posterior  Prenatal labs: ABO, Rh: O/Negative/-- (02/10 0000) Antibody: NEG (02/07 1211) Rubella: Immune (02/10 0000) RPR: Nonreactive (02/10 0000)  HBsAg: Negative (02/10 0000)  HIV: Non-reactive (02/10 0000)  GBS: Negative (08/17 0000)   Assessment/Plan: 38 yo G6P3 in active labor   Jazir Newey II,Verne Lanuza E 09/25/2013, 1:43 PM

## 2013-09-25 NOTE — MAU Note (Signed)
Was 3-4 cm at the office, MD stripped membranes- told pt to go for a walk and come in when contractions got worse.  Now every ~ 5 min, getting stronger.

## 2013-09-26 LAB — CBC
HCT: 28.8 % — ABNORMAL LOW (ref 36.0–46.0)
HEMOGLOBIN: 9.5 g/dL — AB (ref 12.0–15.0)
MCH: 30.9 pg (ref 26.0–34.0)
MCHC: 33 g/dL (ref 30.0–36.0)
MCV: 93.8 fL (ref 78.0–100.0)
Platelets: 153 10*3/uL (ref 150–400)
RBC: 3.07 MIL/uL — ABNORMAL LOW (ref 3.87–5.11)
RDW: 14.7 % (ref 11.5–15.5)
WBC: 14.4 10*3/uL — ABNORMAL HIGH (ref 4.0–10.5)

## 2013-09-26 MED ORDER — RHO D IMMUNE GLOBULIN 1500 UNIT/2ML IJ SOSY
300.0000 ug | PREFILLED_SYRINGE | Freq: Once | INTRAMUSCULAR | Status: AC
  Administered 2013-09-26: 300 ug via INTRAMUSCULAR
  Filled 2013-09-26: qty 2

## 2013-09-26 NOTE — Anesthesia Postprocedure Evaluation (Signed)
Anesthesia Post Note  Patient: Tanya Miller  Procedure(s) Performed: * No procedures listed *  Anesthesia type: Epidural  Patient location: Mother/Baby  Post pain: Pain level controlled  Post assessment: Post-op Vital signs reviewed  Last Vitals:  Filed Vitals:   09/26/13 0635  BP: 108/67  Pulse: 76  Temp: 36.8 C  Resp: 18    Post vital signs: Reviewed  Level of consciousness:alert  Complications: No apparent anesthesia complications

## 2013-09-26 NOTE — Progress Notes (Signed)
Post Partum Day 1 Subjective: no complaints, up ad lib, voiding, tolerating PO and + flatus  Objective: Blood pressure 108/67, pulse 76, temperature 98.2 F (36.8 C), temperature source Oral, resp. rate 18, last menstrual period 12/26/2012, SpO2 98.00%, unknown if currently breastfeeding.  Physical Exam:  General: alert and cooperative Lochia: appropriate Uterine Fundus: firm Incision: perineum intact DVT Evaluation: No evidence of DVT seen on physical exam. Negative Homan's sign. No cords or calf tenderness. No significant calf/ankle edema.   Recent Labs  09/25/13 1110 09/26/13 0618  HGB 11.1* 9.5*  HCT 33.4* 28.8*    Assessment/Plan: Plan for discharge tomorrow and Circumcision prior to discharge   LOS: 1 day   Elwood Bazinet G 09/26/2013, 8:04 AM

## 2013-09-26 NOTE — Progress Notes (Addendum)
MOB was referred for history of anxiety.  Referral screened out by Clinical Social Worker because none of the following criteria appear to apply: -History of anxiety/depression during this pregnancy, or of post-partum depression. - Diagnosis of anxiety and/or depression within last 3 years -History of depression due to pregnancy loss/loss of child OR -Patient's symptoms currently being treated with medication and/or therapy.  CSW completed chart review and noted that MOB is currently prescribed Zoloft.  CSW spoke to bedside RN who denied any presence of acute symptoms.    Re-consult CSW if needs arise or upon MOB's request.

## 2013-09-27 LAB — RH IG WORKUP (INCLUDES ABO/RH)
ABO/RH(D): O NEG
ANTIBODY SCREEN: POSITIVE
DAT, IgG: NEGATIVE
Fetal Screen: NEGATIVE
Gestational Age(Wks): 39
UNIT DIVISION: 0

## 2013-09-27 MED ORDER — IBUPROFEN 600 MG PO TABS: 600.0000 mg | ORAL_TABLET | Freq: Four times a day (QID) | ORAL | Status: DC

## 2013-09-27 MED ORDER — INFLUENZA VAC SPLIT QUAD 0.5 ML IM SUSY
0.5000 mL | PREFILLED_SYRINGE | INTRAMUSCULAR | Status: AC
  Administered 2013-09-27: 0.5 mL via INTRAMUSCULAR

## 2013-09-27 NOTE — Lactation Note (Signed)
This note was copied from the chart of Tanya Miller. Lactation Consultation Note  Patient Name: Tanya Miller [id number] Date: 09/27/2013 Reason for consult: Follow-up assessment Baby 45 hours of life. Mom reports no problems with breastfeeding. Baby latched deeply, suckling rhythmically, with swallows noted. Mom aware of OP/BFSG and LC phone line services.  Maternal Data    Feeding Feeding Type:  (Mom nursing when [facility] walked in.)  LATCH Score/Interventions Latch:  (Baby already nursing, when [facility] entered room.)                    Lactation Tools Discussed/Used     Consult Status Consult Status: Complete    Nancy Nordmann, Tammera Engert 09/27/2013, 12:00 PM

## 2013-09-27 NOTE — Discharge Summary (Signed)
Obstetric Discharge Summary Reason for Admission: onset of labor Prenatal Procedures: ultrasound Intrapartum Procedures: spontaneous vaginal delivery Postpartum Procedures: none Complications-Operative and Postpartum: 2 degree perineal laceration Hemoglobin  Date Value Ref Range Status  09/26/2013 9.5* 12.0 - 15.0 g/dL Final     HCT  Date Value Ref Range Status  09/26/2013 28.8* 36.0 - 46.0 % Final    Physical Exam:  General: alert and cooperative Lochia: appropriate Uterine Fundus: firm Incision: perineum intact DVT Evaluation: No evidence of DVT seen on physical exam. Negative Homan's sign. No cords or calf tenderness. No significant calf/ankle edema.  Discharge Diagnoses: Term Pregnancy-delivered  Discharge Information: Date: 09/27/2013 Activity: pelvic rest Diet: routine Medications: PNV, Ibuprofen and zoloft Condition: stable Instructions: refer to practice specific booklet Discharge to: home   Newborn Data: Live born female  Birth Weight: 8 lb 7.5 oz (3840 g) APGAR: 7, 9  Home with mother.  CURTIS,CAROL G 09/27/2013, 8:03 AM

## 2013-11-06 ENCOUNTER — Other Ambulatory Visit: Payer: Self-pay | Admitting: Obstetrics and Gynecology

## 2013-11-07 LAB — CYTOLOGY - PAP

## 2013-11-19 ENCOUNTER — Encounter (HOSPITAL_COMMUNITY): Payer: Self-pay | Admitting: *Deleted

## 2014-11-15 LAB — HM PAP SMEAR: HM PAP: NORMAL

## 2015-02-18 ENCOUNTER — Encounter: Payer: Self-pay | Admitting: Internal Medicine

## 2015-02-18 ENCOUNTER — Ambulatory Visit (INDEPENDENT_AMBULATORY_CARE_PROVIDER_SITE_OTHER): Payer: BC Managed Care – PPO | Admitting: Internal Medicine

## 2015-02-18 VITALS — BP 116/74 | HR 77 | Temp 97.5°F | Ht 63.0 in | Wt 162.0 lb

## 2015-02-18 DIAGNOSIS — R300 Dysuria: Secondary | ICD-10-CM

## 2015-02-18 DIAGNOSIS — N390 Urinary tract infection, site not specified: Secondary | ICD-10-CM | POA: Diagnosis not present

## 2015-02-18 DIAGNOSIS — F411 Generalized anxiety disorder: Secondary | ICD-10-CM | POA: Diagnosis not present

## 2015-02-18 DIAGNOSIS — E785 Hyperlipidemia, unspecified: Secondary | ICD-10-CM

## 2015-02-18 LAB — POC URINALSYSI DIPSTICK (AUTOMATED)
Bilirubin, UA: NEGATIVE
GLUCOSE UA: NEGATIVE
Ketones, UA: NEGATIVE
NITRITE UA: POSITIVE
Protein, UA: NEGATIVE
Spec Grav, UA: >=1.03
Urobilinogen, UA: NEGATIVE
pH, UA: 6

## 2015-02-18 MED ORDER — CIPROFLOXACIN HCL 500 MG PO TABS: 500.0000 mg | ORAL_TABLET | Freq: Two times a day (BID) | ORAL | Status: DC

## 2015-02-18 NOTE — Progress Notes (Signed)
HPI  Pt presents to the clinic today to establish care and for management of the conditions listed below. She is transferring care from Dr. Laurine Blazer at [place].  Anxiety: Currently managed on Zoloft 125 mg daily. She does not see a therapist. She denies depression, SI/HI.  HLD: She reports she is not currently on medication for this because she was breastfeeding. She does try to consume a low fat diet.  She also reports urinary frequency and dysuria. This started 2 days ago. She has also noticed a strong odor to her urine but denies blood in her urine. She denies vaginal discharge. She has not tried anything OTC for this.  Flu: 10/2014 Tetanus: 2015 Pap Smear: 09/12/2014 Vision Screening: yearly Dentist: biannually   Past Medical History  Diagnosis Date  . Anxiety     Current Outpatient Prescriptions  Medication Sig Dispense Refill  . Ascorbic Acid (VITAMIN C) 1000 MG tablet Take 1,000 mg by mouth daily.    . cholecalciferol (VITAMIN D) 1000 units tablet Take 2,000 Units by mouth daily.    Marland Kitchen ibuprofen (ADVIL,MOTRIN) 600 MG tablet Take 1 tablet (600 mg total) by mouth every 6 (six) hours. 30 tablet 1  . Prenatal Vit-Fe Fumarate-FA (PRENATAL MULTIVITAMIN) TABS Take 1 tablet by mouth daily at 12 noon.    . Probiotic Product (PROBIOTIC DAILY PO) Take 1 tablet by mouth daily.    . sertraline (ZOLOFT) 100 MG tablet Take 100 mg by mouth daily.     . sertraline (ZOLOFT) 25 MG tablet Take 25 mg by mouth daily.      No current facility-administered medications for this visit.    Allergies  Allergen Reactions  . Flagyl [Metronidazole] Hives and Nausea Only    Family History  Problem Relation Age of Onset  . Prostate cancer Father   . Hyperlipidemia Father     Social History   Social History  . Marital Status: Married    Spouse Name: N/A  . Number of Children: N/A  . Years of Education: N/A   Occupational History  . Not on file.   Social History Main Topics  . Smoking status:  Never Smoker   . Smokeless tobacco: Never Used  . Alcohol Use: No  . Drug Use: No  . Sexual Activity: Yes   Other Topics Concern  . Not on file   Social History Narrative    ROS:  Constitutional: Denies fever, malaise, fatigue, headache or abrupt weight changes.  HEENT: Denies eye pain, eye redness, ear pain, ringing in the ears, wax buildup, runny nose, nasal congestion, bloody nose, or sore throat. Respiratory: Denies difficulty breathing, shortness of breath, cough or sputum production.   Cardiovascular: Denies chest pain, chest tightness, palpitations or swelling in the hands or feet.  Gastrointestinal: Denies abdominal pain, bloating, constipation, diarrhea or blood in the stool.  GU: Pt reports dysuria and frequency. Denies urgency, blood in urine, odor or discharge. Musculoskeletal: Denies decrease in range of motion, difficulty with gait, muscle pain or joint pain and swelling.  Skin: Denies redness, rashes, lesions or ulcercations.  Neurological: Denies dizziness, difficulty with memory, difficulty with speech or problems with balance and coordination.  Psych: Pt reports anxiety. Denies depression, SI/HI.  No other specific complaints in a complete review of systems (except as listed in HPI above).  PE:  BP 116/74 mmHg  Pulse 77  Temp(Src) 97.5 F (36.4 C) (Oral)  Ht  (1.6 m)  Wt 162 lb (73.483 kg)  BMI 28.70 kg/m2  SpO2 98%  LMP   Wt Readings from Last 3 Encounters:  02/18/15 162 lb (73.483 kg)  09/21/13 168 lb (76.204 kg)  02/24/13 146 lb (66.225 kg)    General: Appears her stated age, well developed, well nourished in NAD. Cardiovascular: Normal rate and rhythm. S1,S2 noted.  No murmur, rubs or gallops noted.  Pulmonary/Chest: Normal effort and positive vesicular breath sounds. No respiratory distress. No wheezes, rales or ronchi noted.  Abdomen: Soft and nontender. Normal bowel sounds. No CVA tenderness. Neurological: Alert and  oriented. Psychiatric: Mood and affect normal. Behavior is normal. Judgment and thought content normal.    Assessment and Plan:  Urinary frequency and dysuria:  Urinalysis: 2+ leuks and 3+ blood Will send urine culture eRx for Cipro 500 mg BID x 5 days  RTC in 6 months for annual exam

## 2015-02-18 NOTE — Patient Instructions (Signed)

## 2015-02-18 NOTE — Assessment & Plan Note (Signed)
Will check CMET and Lipid profile today Encouraged her to consume a low fat diet 

## 2015-02-18 NOTE — Assessment & Plan Note (Signed)
Stable on Zoloft Support offered today

## 2015-02-18 NOTE — Progress Notes (Signed)
Pre visit review using our clinic review tool, if applicable. No additional management support is needed unless otherwise documented below in the visit note. 

## 2015-02-18 NOTE — Addendum Note (Signed)
Addended by: Roena Malady on: 02/18/2015 04:46 PM   Modules accepted: Orders

## 2015-02-19 LAB — COMPREHENSIVE METABOLIC PANEL
ALT: 19 U/L (ref 0–35)
AST: 18 U/L (ref 0–37)
Albumin: 4.6 g/dL (ref 3.5–5.2)
Alkaline Phosphatase: 58 U/L (ref 39–117)
BUN: 15 mg/dL (ref 6–23)
CALCIUM: 10 mg/dL (ref 8.4–10.5)
CHLORIDE: 99 meq/L (ref 96–112)
CO2: 29 meq/L (ref 19–32)
CREATININE: 0.79 mg/dL (ref 0.40–1.20)
GFR: 86.37 mL/min (ref >60.00)
Glucose, Bld: 127 mg/dL — ABNORMAL HIGH (ref 70–99)
POTASSIUM: 3.6 meq/L (ref 3.5–5.1)
Sodium: 138 mEq/L (ref 135–145)
Total Bilirubin: 0.5 mg/dL (ref 0.2–1.2)
Total Protein: 7.6 g/dL (ref 6.0–8.3)

## 2015-02-19 LAB — LIPID PANEL
CHOLESTEROL: 247 mg/dL — AB (ref 0–200)
HDL: 34.9 mg/dL — AB (ref >39.00)
TRIGLYCERIDES: 426 mg/dL — AB (ref 0.0–149.0)
Total CHOL/HDL Ratio: 7

## 2015-02-19 LAB — LDL CHOLESTEROL, DIRECT: Direct LDL: 169 mg/dL

## 2015-02-21 LAB — URINE CULTURE

## 2015-02-24 NOTE — Addendum Note (Signed)
Addended by: Roena Malady on: 02/24/2015 05:11 PM   Modules accepted: Orders

## 2015-03-11 ENCOUNTER — Encounter: Payer: Self-pay | Admitting: Internal Medicine

## 2015-03-11 ENCOUNTER — Ambulatory Visit (INDEPENDENT_AMBULATORY_CARE_PROVIDER_SITE_OTHER)
Admission: RE | Admit: 2015-03-11 | Discharge: 2015-03-11 | Disposition: A | Discharge status: Home / Self Care | Payer: BC Managed Care – PPO | Source: Ambulatory Visit | Attending: Internal Medicine | Admitting: Internal Medicine

## 2015-03-11 ENCOUNTER — Ambulatory Visit (INDEPENDENT_AMBULATORY_CARE_PROVIDER_SITE_OTHER): Payer: BC Managed Care – PPO | Admitting: Internal Medicine

## 2015-03-11 VITALS — BP 108/64 | HR 76 | Temp 98.2°F | Wt 156.0 lb

## 2015-03-11 DIAGNOSIS — R0789 Other chest pain: Secondary | ICD-10-CM

## 2015-03-11 LAB — AMYLASE: Amylase: 42 U/L (ref 27–131)

## 2015-03-11 LAB — H. PYLORI ANTIBODY, IGG: H Pylori IgG: NEGATIVE

## 2015-03-11 LAB — LIPASE: LIPASE: 40 U/L (ref 11.0–59.0)

## 2015-03-11 NOTE — Patient Instructions (Signed)
Electrocardiography Electrocardiography is a test that records the electrical impulses of the heart. It assesses many aspects of heart health, including:  Heart function.  Heart rhythm.  Heart muscle thickness. Electrocardiography can be done as a routine part of a physical exam. It can also be done to evaluate symptoms such as severe chest pain and heart palpitations. PROCEDURE   Electrocardiography is simple, safe, and painless. No electricity goes through your body during the procedure.  You will be asked to remove your clothes from the waist up and lie on your back for the test.  Sticky patches (electrodes) will be placed on your chest, arms, and legs. The electrodes will be attached by wires to the electrocardiography machine.  You will be asked to relax and lie still for a few seconds while the electrocardiography machine records the electrical activity of your heart. AFTER THE PROCEDURE  If electrocardiography is part of a routine physical exam, you may return to normal activities as told by your health care provider.  Your health care provider or a heart doctor (cardiologist) will interpret the recording.  The test result may not be available during your visit. If your test result is not back during the visit, make an appointment with your health care provider to find out the result. It is your responsibility to get your test results.   This information is not intended to replace advice given to you by your health care provider. Make sure you discuss any questions you have with your health care provider.   Document Released: 01/02/2000 Document Revised: 01/25/2014 Document Reviewed: 05/17/2011 Elsevier Interactive Patient Education 2016 [employer]  

## 2015-03-11 NOTE — Progress Notes (Signed)
Subjective:    Patient ID: Tanya Miller, female    DOB: May 13, 1975, 40 y.o.   MRN: [id number]  HPI  Pt presents to the clinic today with c/o mid sternal chest pressure. This started 1 week ago. It is intermittent but has been occuring more frequently. The pain radiates into her left chest, down her left arm. She does have associated numbness and tingling. She has felt clammy. She denies dizziness, chest pain or shortness of breath. She denies reflux, belching, bloating, gas or epigastric pain. The pain is not related to eating. The pressure does not wake her up in the middle of the night. The pressure is not worse with exertion. She has tried Nexium and Tums without relief. She denies trauma to the chest wall. Shed denies cough.  Review of Systems      Past Medical History  Diagnosis Date  . Anxiety     Current Outpatient Prescriptions  Medication Sig Dispense Refill  . Ascorbic Acid (VITAMIN C) 1000 MG tablet Take 1,000 mg by mouth daily.    . cholecalciferol (VITAMIN D) 1000 units tablet Take 2,000 Units by mouth daily.    Marland Kitchen ibuprofen (ADVIL,MOTRIN) 600 MG tablet Take 1 tablet (600 mg total) by mouth every 6 (six) hours. 30 tablet 1  . Prenatal Vit-Fe Fumarate-FA (PRENATAL MULTIVITAMIN) TABS Take 1 tablet by mouth daily at 12 noon.    . Probiotic Product (PROBIOTIC DAILY PO) Take 1 tablet by mouth daily.    . sertraline (ZOLOFT) 100 MG tablet Take 100 mg by mouth daily.     . sertraline (ZOLOFT) 25 MG tablet Take 25 mg by mouth daily.      No current facility-administered medications for this visit.    Allergies  Allergen Reactions  . Flagyl [Metronidazole] Hives and Nausea Only    Family History  Problem Relation Age of Onset  . Prostate cancer Father   . Hyperlipidemia Father   . Cancer Father     prostate  . Heart disease Father   . Diabetes Neg Hx   . Stroke Neg Hx     Social History   Social History  . Marital Status: Married    Spouse Name: N/A  .  Number of Children: N/A  . Years of Education: N/A   Occupational History  . Not on file.   Social History Main Topics  . Smoking status: Never Smoker   . Smokeless tobacco: Never Used  . Alcohol Use: No  . Drug Use: No  . Sexual Activity: Yes    Birth Control/ Protection: IUD   Other Topics Concern  . Not on file   Social History Narrative     Constitutional: Denies fever, malaise, fatigue, headache or abrupt weight changes.  HEENT: Denies eye pain, eye redness, ear pain, ringing in the ears, wax buildup, runny nose, nasal congestion, bloody nose, or sore throat. Respiratory: Denies difficulty breathing, shortness of breath, cough or sputum production.   Cardiovascular: Pt reports chest pressure. Denies chest pain, palpitations or swelling in the hands or feet.  Gastrointestinal: Denies abdominal pain, bloating, constipation, diarrhea or blood in the stool.  GU: Denies urgency, frequency, pain with urination, burning sensation, blood in urine, odor or discharge. Musculoskeletal: Denies decrease in range of motion, difficulty with gait, muscle pain or joint pain and swelling.  Skin: Denies redness, rashes, lesions or ulcercations.  Neurological: Denies dizziness, difficulty with memory, difficulty with speech or problems with balance and coordination.  Psych: Denies anxiety, depression,  SI/HI.  No other specific complaints in a complete review of systems (except as listed in HPI above).  Objective:   Physical Exam   BP 108/64 mmHg  Pulse 76  Temp(Src) 98.2 F (36.8 C) (Oral)  Wt 156 lb (70.761 kg)  SpO2 99% Wt Readings from Last 3 Encounters:  03/11/15 156 lb (70.761 kg)  02/18/15 162 lb (73.483 kg)  09/21/13 168 lb (76.204 kg)    General: Appears her stated age, well developed, well nourished in NAD. Skin: Clammy. Cardiovascular: Normal rate and rhythm. S1,S2 noted.  No murmur, rubs or gallops noted.  Pulmonary/Chest: Normal effort and positive vesicular breath  sounds. No respiratory distress. No wheezes, rales or ronchi noted.  Abdomen: Soft and nontender. Normal bowel sounds. No distention or masses noted. . Musculoskeletal: Pain not reproduced with palpation of the left chest wall. Neurological: Alert and oriented.  Psychiatric: She does not appear anxious or stressed.  BMET    Component Value Date/Time   NA 138 02/18/2015 1535   K 3.6 02/18/2015 1535   CL 99 02/18/2015 1535   CO2 29 02/18/2015 1535   GLUCOSE 127* 02/18/2015 1535   BUN 15 02/18/2015 1535   CREATININE 0.79 02/18/2015 1535   CALCIUM 10.0 02/18/2015 1535    Lipid Panel     Component Value Date/Time   CHOL 247* 02/18/2015 1535   TRIG 426.0* 02/18/2015 1535   HDL 34.90* 02/18/2015 1535   CHOLHDL 7 02/18/2015 1535    CBC    Component Value Date/Time   WBC 14.4* 09/26/2013 0618   RBC 3.07* 09/26/2013 0618   HGB 9.5* 09/26/2013 0618   HCT 28.8* 09/26/2013 0618   PLT 153 09/26/2013 0618   MCV 93.8 09/26/2013 0618   MCH 30.9 09/26/2013 0618   MCHC 33.0 09/26/2013 0618   RDW 14.7 09/26/2013 0618    Hgb A1C No results found for: HGBA1C      Assessment & Plan:   Left side chest pressure:  Low concerns for ACS or PE Exam benign ECG normal Will check chest xray CBC, CMET and Lipid Profile from 1 month ago reviewed Will check Amylase, Lipase and H Pylori today Continue Nexium through the weekend Update me on Monday if symptoms persist, if worse, go to ER

## 2015-03-11 NOTE — Progress Notes (Signed)
Pre visit review using our clinic review tool, if applicable. No additional management support is needed unless otherwise documented below in the visit note. 

## 2015-03-12 ENCOUNTER — Telehealth: Payer: Self-pay | Admitting: Internal Medicine

## 2015-03-12 NOTE — Telephone Encounter (Signed)
Pt returned your call.  

## 2015-03-21 ENCOUNTER — Ambulatory Visit (INDEPENDENT_AMBULATORY_CARE_PROVIDER_SITE_OTHER): Payer: BC Managed Care – PPO | Admitting: Internal Medicine

## 2015-03-21 ENCOUNTER — Encounter: Payer: Self-pay | Admitting: Internal Medicine

## 2015-03-21 VITALS — BP 110/72 | HR 71 | Temp 98.1°F | Wt 151.0 lb

## 2015-03-21 DIAGNOSIS — J0100 Acute maxillary sinusitis, unspecified: Secondary | ICD-10-CM

## 2015-03-21 MED ORDER — AMOXICILLIN-POT CLAVULANATE 875-125 MG PO TABS: 1.0000 | ORAL_TABLET | Freq: Two times a day (BID) | ORAL | Status: DC

## 2015-03-21 NOTE — Progress Notes (Signed)
HPI  Pt presents to the clinic today with c/o headache, facial pain and pressure, nasal congestion and sore throat. This started 5 days ago. She is blowing clear mucous out of her nose. She denies difficulty swallowing. She denies fever but has felt fatigue, chills and body aches. She has tried Sudafed, Mucinex, Afrin with minimal relief. She has had sinus infections in the past and reports this feels the same. She has not had sick contacts that she is aware of.  Review of Systems    Past Medical History  Diagnosis Date  . Anxiety     Family History  Problem Relation Age of Onset  . Prostate cancer Father   . Hyperlipidemia Father   . Cancer Father     prostate  . Heart disease Father   . Diabetes Neg Hx   . Stroke Neg Hx     Social History   Social History  . Marital Status: Married    Spouse Name: N/A  . Number of Children: N/A  . Years of Education: N/A   Occupational History  . Not on file.   Social History Main Topics  . Smoking status: Never Smoker   . Smokeless tobacco: Never Used  . Alcohol Use: No  . Drug Use: No  . Sexual Activity: Yes    Birth Control/ Protection: IUD   Other Topics Concern  . Not on file   Social History Narrative    Allergies  Allergen Reactions  . Flagyl [Metronidazole] Hives and Nausea Only     Constitutional: Positive headache, fatigue. Denies fever or abrupt weight changes.  HEENT:  Positive facial pain, nasal congestion and sore throat. Denies eye redness, ear pain, ringing in the ears, wax buildup, runny nose or bloody nose. Respiratory: Denies cough, difficulty breathing or shortness of breath.  Cardiovascular: Denies chest pain, chest tightness, palpitations or swelling in the hands or feet.   No other specific complaints in a complete review of systems (except as listed in HPI above).  Objective:  BP 110/72 mmHg  Pulse 71  Temp(Src) 98.1 F (36.7 C) (Oral)  Wt 151 lb (68.493 kg)  SpO2 98%   General: Appears  her stated age, well developed, well nourished in NAD. HEENT: Head: normal shape and size, maxillary sinus tenderness noted; Eyes: sclera white, no icterus, conjunctiva pink; Ears: Tm's gray and intact, normal light reflex; Nose: mucosa boggy and moist, septum midline; Throat/Mouth: + PND. Teeth present, mucosa pink and moist, no exudate noted, no lesions or ulcerations noted.  Neck:  Cervical lymphadenopathy noted bilaterally.  Cardiovascular: Normal rate and rhythm. S1,S2 noted.  No murmur, rubs or gallops noted.  Pulmonary/Chest: Normal effort and positive vesicular breath sounds. No respiratory distress. No wheezes, rales or ronchi noted.      Assessment & Plan:   Acute bacterial sinusitis  Can use a Neti Pot which can be purchased from your local drug store. Flonase 2 sprays each nostril for 3 days and then as needed. Augmentin BID for 10 days  RTC as needed or if symptoms persist.

## 2015-03-21 NOTE — Progress Notes (Signed)
Pre visit review using our clinic review tool, if applicable. No additional management support is needed unless otherwise documented below in the visit note. 

## 2015-03-21 NOTE — Patient Instructions (Signed)

## 2015-03-27 ENCOUNTER — Encounter: Payer: Self-pay | Admitting: Internal Medicine

## 2015-03-27 ENCOUNTER — Other Ambulatory Visit: Payer: Self-pay | Admitting: Internal Medicine

## 2015-03-27 MED ORDER — FLUCONAZOLE 150 MG PO TABS: 150.0000 mg | ORAL_TABLET | Freq: Once | ORAL | Status: DC

## 2015-04-24 ENCOUNTER — Ambulatory Visit: Payer: BC Managed Care – PPO | Admitting: Internal Medicine

## 2015-04-28 ENCOUNTER — Encounter: Payer: Self-pay | Admitting: Internal Medicine

## 2015-05-02 ENCOUNTER — Encounter: Payer: Self-pay | Admitting: Internal Medicine

## 2015-05-05 ENCOUNTER — Other Ambulatory Visit: Payer: Self-pay | Admitting: Internal Medicine

## 2015-05-05 MED ORDER — DOXYCYCLINE HYCLATE 100 MG PO TABS: 100.0000 mg | ORAL_TABLET | Freq: Two times a day (BID) | ORAL | Status: DC

## 2015-05-26 ENCOUNTER — Other Ambulatory Visit: Payer: BC Managed Care – PPO

## 2015-05-27 ENCOUNTER — Other Ambulatory Visit (INDEPENDENT_AMBULATORY_CARE_PROVIDER_SITE_OTHER): Payer: BC Managed Care – PPO

## 2015-05-27 DIAGNOSIS — E785 Hyperlipidemia, unspecified: Secondary | ICD-10-CM

## 2015-05-27 LAB — LDL CHOLESTEROL, DIRECT: Direct LDL: 164 mg/dL

## 2015-05-27 LAB — LIPID PANEL
CHOL/HDL RATIO: 6
CHOLESTEROL: 245 mg/dL — AB (ref 0–200)
HDL: 41.9 mg/dL (ref >39.00)
NONHDL: 203.42
TRIGLYCERIDES: 241 mg/dL — AB (ref 0.0–149.0)
VLDL: 48.2 mg/dL — AB (ref 0.0–40.0)

## 2015-05-28 MED ORDER — SIMVASTATIN 20 MG PO TABS: 20.0000 mg | ORAL_TABLET | Freq: Every day | ORAL | Status: DC

## 2015-05-28 NOTE — Addendum Note (Signed)
Addended by: Roena MaladyEVONTENNO, Nicholl Onstott Y on: 05/28/2015 12:17 PM   Modules accepted: Orders

## 2015-06-01 ENCOUNTER — Encounter: Payer: Self-pay | Admitting: Internal Medicine

## 2015-06-10 ENCOUNTER — Encounter: Payer: Self-pay | Admitting: Internal Medicine

## 2015-06-30 ENCOUNTER — Encounter: Payer: Self-pay | Admitting: Internal Medicine

## 2015-07-23 ENCOUNTER — Other Ambulatory Visit: Payer: Self-pay | Admitting: Family Medicine

## 2015-07-23 ENCOUNTER — Encounter: Payer: Self-pay | Admitting: Internal Medicine

## 2015-07-23 MED ORDER — SERTRALINE HCL 100 MG PO TABS: 150.0000 mg | ORAL_TABLET | Freq: Every day | ORAL | Status: DC

## 2015-08-04 ENCOUNTER — Encounter: Payer: Self-pay | Admitting: Internal Medicine

## 2015-08-29 ENCOUNTER — Encounter: Payer: Self-pay | Admitting: Internal Medicine

## 2015-11-05 ENCOUNTER — Encounter: Payer: Self-pay | Admitting: Internal Medicine

## 2015-12-26 ENCOUNTER — Ambulatory Visit: Payer: BC Managed Care – PPO | Admitting: Internal Medicine

## 2015-12-26 DIAGNOSIS — Z0289 Encounter for other administrative examinations: Secondary | ICD-10-CM

## 2016-01-02 ENCOUNTER — Other Ambulatory Visit: Payer: Self-pay | Admitting: Family Medicine

## 2016-01-06 ENCOUNTER — Encounter: Payer: Self-pay | Admitting: Internal Medicine

## 2016-01-09 ENCOUNTER — Encounter: Payer: Self-pay | Admitting: Internal Medicine

## 2016-01-09 ENCOUNTER — Ambulatory Visit (INDEPENDENT_AMBULATORY_CARE_PROVIDER_SITE_OTHER): Payer: BC Managed Care – PPO | Admitting: Internal Medicine

## 2016-01-09 VITALS — BP 112/70 | HR 70 | Temp 97.8°F | Wt 155.8 lb

## 2016-01-09 DIAGNOSIS — K219 Gastro-esophageal reflux disease without esophagitis: Secondary | ICD-10-CM | POA: Diagnosis not present

## 2016-01-09 DIAGNOSIS — E7800 Pure hypercholesterolemia, unspecified: Secondary | ICD-10-CM

## 2016-01-09 DIAGNOSIS — N912 Amenorrhea, unspecified: Secondary | ICD-10-CM | POA: Diagnosis not present

## 2016-01-09 DIAGNOSIS — F411 Generalized anxiety disorder: Secondary | ICD-10-CM | POA: Diagnosis not present

## 2016-01-09 LAB — COMPREHENSIVE METABOLIC PANEL
ALBUMIN: 4.4 g/dL (ref 3.5–5.2)
ALK PHOS: 52 U/L (ref 39–117)
ALT: 31 U/L (ref 0–35)
AST: 22 U/L (ref 0–37)
BILIRUBIN TOTAL: 0.4 mg/dL (ref 0.2–1.2)
BUN: 19 mg/dL (ref 6–23)
CO2: 30 mEq/L (ref 19–32)
Calcium: 9.2 mg/dL (ref 8.4–10.5)
Chloride: 103 mEq/L (ref 96–112)
Creatinine, Ser: 0.78 mg/dL (ref 0.40–1.20)
GFR: 87.24 mL/min (ref >60.00)
GLUCOSE: 101 mg/dL — AB (ref 70–99)
Potassium: 4 mEq/L (ref 3.5–5.1)
SODIUM: 137 meq/L (ref 135–145)
TOTAL PROTEIN: 7 g/dL (ref 6.0–8.3)

## 2016-01-09 LAB — LDL CHOLESTEROL, DIRECT: LDL DIRECT: 137 mg/dL

## 2016-01-09 LAB — LIPID PANEL
Cholesterol: 234 mg/dL — ABNORMAL HIGH (ref 0–200)
HDL: 33.5 mg/dL — ABNORMAL LOW (ref >39.00)
NonHDL: 200.38
Total CHOL/HDL Ratio: 7
Triglycerides: 355 mg/dL — ABNORMAL HIGH (ref 0.0–149.0)
VLDL: 71 mg/dL — ABNORMAL HIGH (ref 0.0–40.0)

## 2016-01-09 MED ORDER — SERTRALINE HCL 100 MG PO TABS: 150.0000 mg | ORAL_TABLET | Freq: Every day | ORAL | 5 refills | Status: DC

## 2016-01-09 NOTE — Assessment & Plan Note (Signed)
Advised her to try to identify triggers Continue Nexium prn

## 2016-01-09 NOTE — Progress Notes (Signed)
Subjective:    Patient ID: Tanya Miller, female    DOB: 1975-05-28, 40 y.o.   MRN: [id number]  HPI  Pt presents to the clinic today for follow up chronic conditions.  GAD: Triggered by just general life stress. She is taking Zoloft as prescribed. She denies depression, SI/HI.  HLD: His last LDL was 164.0, 05/2015. She is not taking the Zocor, because of bad side effects. She does not consume a low fat diet.   GERD: She is not sure what triggers this. She only takes the Nexium as needed with good relief.   She is concerned that she has not had a period since she took the Mirena out 11/2015. She would like a pregnancy test today.  Review of Systems      Past Medical History:  Diagnosis Date  . Anxiety     Current Outpatient Prescriptions  Medication Sig Dispense Refill  . amoxicillin-clavulanate (AUGMENTIN) 875-125 MG tablet Take 1 tablet by mouth 2 (two) times daily. 20 tablet 0  . Ascorbic Acid (VITAMIN C) 1000 MG tablet Take 1,000 mg by mouth daily.    . cholecalciferol (VITAMIN D) 1000 units tablet Take 2,000 Units by mouth daily.    Marland Kitchen. doxycycline (VIBRA-TABS) 100 MG tablet Take 1 tablet (100 mg total) by mouth 2 (two) times daily. 20 tablet 0  . esomeprazole (NEXIUM) 20 MG capsule Take 20 mg by mouth daily at 12 noon.    . fluconazole (DIFLUCAN) 150 MG tablet Take 1 tablet (150 mg total) by mouth once. 1 tablet 0  . ibuprofen (ADVIL,MOTRIN) 600 MG tablet Take 1 tablet (600 mg total) by mouth every 6 (six) hours. 30 tablet 1  . Prenatal Vit-Fe Fumarate-FA (PRENATAL MULTIVITAMIN) TABS Take 1 tablet by mouth daily at 12 noon.    . Probiotic Product (PROBIOTIC DAILY PO) Take 1 tablet by mouth daily.    . sertraline (ZOLOFT) 100 MG tablet Take 1.5 tablets (150 mg total) by mouth daily. MUST SCHEDULE ANNUAL EXAM FOR FURTHER REFILLS 60 tablet 0  . simvastatin (ZOCOR) 20 MG tablet Take 1 tablet (20 mg total) by mouth at bedtime. 30 tablet 2   No current facility-administered  medications for this visit.     Allergies  Allergen Reactions  . Flagyl [Metronidazole] Hives and Nausea Only    Family History  Problem Relation Age of Onset  . Prostate cancer Father   . Hyperlipidemia Father   . Cancer Father     prostate  . Heart disease Father   . Diabetes Neg Hx   . Stroke Neg Hx     Social History   Social History  . Marital status: Married    Spouse name: N/A  . Number of children: N/A  . Years of education: N/A   Occupational History  . Not on file.   Social History Main Topics  . Smoking status: Never Smoker  . Smokeless tobacco: Never Used  . Alcohol use No  . Drug use: No  . Sexual activity: Yes    Birth control/ protection: IUD   Other Topics Concern  . Not on file   Social History Narrative  . No narrative on file     Constitutional: Denies fever, malaise, fatigue, headache or abrupt weight changes.  Respiratory: Denies difficulty breathing, shortness of breath, cough or sputum production.   Cardiovascular: Denies chest pain, chest tightness, palpitations or swelling in the hands or feet.  Gastrointestinal: Denies abdominal pain, bloating, constipation, diarrhea or blood in  the stool.  GU: Pt report amenorrhea. Denies urgency, frequency, pain with urination, burning sensation, blood in urine, odor or discharge. Neurological: Denies dizziness, difficulty with memory, difficulty with speech or problems with balance and coordination.  Psych: Pt reports anxiety. Denies depression, SI/HI.  No other specific complaints in a complete review of systems (except as listed in HPI above).  Objective:   Physical Exam  BP 112/70   Pulse 70   Temp 97.8 F (36.6 C) (Oral)   Wt 155 lb 12 oz (70.6 kg)   SpO2 98%   BMI 27.59 kg/m  Wt Readings from Last 3 Encounters:  01/09/16 155 lb 12 oz (70.6 kg)  03/21/15 151 lb (68.5 kg)  03/11/15 156 lb (70.8 kg)    General: Appears her stated age, well developed, well nourished in  NAD. Cardiovascular: Normal rate and rhythm. S1,S2 noted.  No murmur, rubs or gallops noted.  Pulmonary/Chest: Normal effort and positive vesicular breath sounds. No respiratory distress. No wheezes, rales or ronchi noted.  Abdomen: Soft and nontender. Normal bowel sounds. No distention or masses noted. Neurological: Alert and oriented.  Psychiatric: Mood and affect normal. Behavior is normal. Judgment and thought content normal.     BMET    Component Value Date/Time   NA 138 02/18/2015 1535   K 3.6 02/18/2015 1535   CL 99 02/18/2015 1535   CO2 29 02/18/2015 1535   GLUCOSE 127 (H) 02/18/2015 1535   BUN 15 02/18/2015 1535   CREATININE 0.79 02/18/2015 1535   CALCIUM 10.0 02/18/2015 1535    Lipid Panel     Component Value Date/Time   CHOL 245 (H) 05/27/2015 0943   TRIG 241.0 (H) 05/27/2015 0943   HDL 41.90 05/27/2015 0943   CHOLHDL 6 05/27/2015 0943   VLDL 48.2 (H) 05/27/2015 0943    CBC    Component Value Date/Time   WBC 14.4 (H) 09/26/2013 0618   RBC 3.07 (L) 09/26/2013 0618   HGB 9.5 (L) 09/26/2013 0618   HCT 28.8 (L) 09/26/2013 0618   PLT 153 09/26/2013 0618   MCV 93.8 09/26/2013 0618   MCH 30.9 09/26/2013 0618   MCHC 33.0 09/26/2013 0618   RDW 14.7 09/26/2013 0618    Hgb A1C No results found for: HGBA1C         Assessment & Plan:   Amenorrhea:  Urine preg: negative Advised her it can take a few months for her system to regulate  RTC in 6 months for your annual exam Nicki ReaperBAITY, REGINA, NP

## 2016-01-09 NOTE — Patient Instructions (Signed)
Fat and Cholesterol Restricted Diet Introduction Getting too much fat and cholesterol in your diet may cause health problems. Following this diet helps keep your fat and cholesterol at normal levels. This can keep you from getting sick. What types of fat should I choose?  Choose monosaturated and polyunsaturated fats. These are found in foods such as olive oil, canola oil, flaxseeds, walnuts, almonds, and seeds.  Eat more omega-3 fats. Good choices include salmon, mackerel, sardines, tuna, flaxseed oil, and ground flaxseeds.  Limit saturated fats. These are in animal products such as meats, butter, and cream. They can also be in plant products such as palm oil, palm kernel oil, and coconut oil.  Avoid foods with partially hydrogenated oils in them. These contain trans fats. Examples of foods that have trans fats are stick margarine, some tub margarines, cookies, crackers, and other baked goods. What general guidelines do I need to follow?  Check food labels. Look for the words "trans fat" and "saturated fat."  When preparing a meal:  Fill half of your plate with vegetables and green salads.  Fill one fourth of your plate with whole grains. Look for the word "whole" as the first word in the ingredient list.  Fill one fourth of your plate with lean protein foods.  Eat more foods that have fiber, like apples, carrots, beans, peas, and barley.  Eat more home-cooked foods. Eat less at restaurants and buffets.  Limit or avoid alcohol.  Limit foods high in starch and sugar.  Limit fried foods.  Cook foods without frying them. Baking, boiling, grilling, and broiling are all great options.  Lose weight if you are overweight. Losing even a small amount of weight can help your overall health. It can also help prevent diseases such as diabetes and heart disease. What foods can I eat? Grains  Whole grains, such as whole wheat or whole grain breads, crackers, cereals, and pasta. Unsweetened  oatmeal, bulgur, barley, quinoa, or brown rice. Corn or whole wheat flour tortillas. Vegetables  Fresh or frozen vegetables (raw, steamed, roasted, or grilled). Green salads. Fruits  All fresh, canned (in natural juice), or frozen fruits. Meat and Other Protein Products  Ground beef (85% or leaner), grass-fed beef, or beef trimmed of fat. Skinless chicken or turkey. Ground chicken or turkey. Pork trimmed of fat. All fish and seafood. Eggs. Dried beans, peas, or lentils. Unsalted nuts or seeds. Unsalted canned or dry beans. Dairy  Low-fat dairy products, such as skim or 1% milk, 2% or reduced-fat cheeses, low-fat ricotta or cottage cheese, or plain low-fat yogurt. Fats and Oils  Tub margarines without trans fats. Light or reduced-fat mayonnaise and salad dressings. Avocado. Olive, canola, sesame, or safflower oils. Natural peanut or almond butter (choose ones without added sugar and oil). The items listed above may not be a complete list of recommended foods or beverages. Contact your dietitian for more options.  What foods are not recommended? Grains  White bread. White pasta. White rice. Cornbread. Bagels, pastries, and croissants. Crackers that contain trans fat. Vegetables  White potatoes. Corn. Creamed or fried vegetables. Vegetables in a cheese sauce. Fruits  Dried fruits. Canned fruit in light or heavy syrup. Fruit juice. Meat and Other Protein Products  Fatty cuts of meat. Ribs, chicken wings, bacon, sausage, bologna, salami, chitterlings, fatback, hot dogs, bratwurst, and packaged luncheon meats. Liver and organ meats. Dairy  Whole or 2% milk, cream, half-and-half, and cream cheese. Whole milk cheeses. Whole-fat or sweetened yogurt. Full-fat cheeses. Nondairy creamers and   whipped toppings. Processed cheese, cheese spreads, or cheese curds. Sweets and Desserts  Corn syrup, sugars, honey, and molasses. Candy. Jam and jelly. Syrup. Sweetened cereals. Cookies, pies, cakes, donuts,  muffins, and ice cream. Fats and Oils  Butter, stick margarine, lard, shortening, ghee, or bacon fat. Coconut, palm kernel, or palm oils. Beverages  Alcohol. Sweetened drinks (such as sodas, lemonade, and fruit drinks or punches). The items listed above may not be a complete list of foods and beverages to avoid. Contact your dietitian for more information.  This information is not intended to replace advice given to you by your health care provider. Make sure you discuss any questions you have with your health care provider. Document Released: 07/06/2011 Document Revised: 09/11/2015 Document Reviewed: 04/05/2013  2017 Elsevier  

## 2016-01-09 NOTE — Assessment & Plan Note (Signed)
Lipid profile and CMET today She does not want to take statins Consider red yeast rice if LDL not at goal Encouraged her to consume a low fat diet

## 2016-01-09 NOTE — Assessment & Plan Note (Signed)
Stable on Zoloft Refilled today

## 2016-01-13 ENCOUNTER — Encounter: Payer: Self-pay | Admitting: *Deleted

## 2016-01-26 ENCOUNTER — Ambulatory Visit (INDEPENDENT_AMBULATORY_CARE_PROVIDER_SITE_OTHER): Payer: BC Managed Care – PPO | Admitting: Internal Medicine

## 2016-01-26 ENCOUNTER — Encounter: Payer: Self-pay | Admitting: Internal Medicine

## 2016-01-26 VITALS — BP 110/76 | HR 72 | Temp 98.7°F | Wt 164.0 lb

## 2016-01-26 DIAGNOSIS — R231 Pallor: Secondary | ICD-10-CM

## 2016-01-26 DIAGNOSIS — R42 Dizziness and giddiness: Secondary | ICD-10-CM | POA: Diagnosis not present

## 2016-01-26 DIAGNOSIS — R450 Nervousness: Secondary | ICD-10-CM | POA: Diagnosis not present

## 2016-01-26 DIAGNOSIS — R631 Polydipsia: Secondary | ICD-10-CM

## 2016-01-26 DIAGNOSIS — J329 Chronic sinusitis, unspecified: Secondary | ICD-10-CM | POA: Diagnosis not present

## 2016-01-26 DIAGNOSIS — B9789 Other viral agents as the cause of diseases classified elsewhere: Secondary | ICD-10-CM

## 2016-01-26 LAB — TSH: TSH: 2.31 u[IU]/mL (ref 0.35–4.50)

## 2016-01-26 LAB — HEMOGLOBIN A1C: Hgb A1c MFr Bld: 5 % (ref 4.6–6.5)

## 2016-01-26 NOTE — Progress Notes (Signed)
Subjective:    Patient ID: Tanya Miller, female    DOB: Oct 03, 1975, 41 y.o.   MRN: [id number]  HPI  Pt presents to the clinic today with c/o headache, facial pressure, nasal congestion and sore throat. This started 1 week ago. She is not blowing anything out of her nose. She denies difficulty swallowing. She denies fever, chills or body aches. She has tried Sudafed and Ibuprofen with good relief. She has no history of seasonal allergies. She has had sick contacts.  She also is concerned that she is having issues with hypoglycemia. She reports there are often times that she feels jittery, sweaty, lightheaded and extreme thirst. This started months ago but has gotten worse over the last 2 weeks. It often occurs about 2-3 hours after eating. She reports she will typically eat something and feel remarkably better. She reports she eats every 4 hours. She has recently changed her diet to cut out soda. She has no meter to actually check her blood sugar with. She has not tried any medications OTC for this. She has no history of DM.  Review of Systems  Past Medical History:  Diagnosis Date  . Anxiety     Current Outpatient Prescriptions  Medication Sig Dispense Refill  . Ascorbic Acid (VITAMIN C) 1000 MG tablet Take 1,000 mg by mouth daily.    . cholecalciferol (VITAMIN D) 1000 units tablet Take 2,000 Units by mouth daily.    . Prenatal Vit-Fe Fumarate-FA (PRENATAL MULTIVITAMIN) TABS Take 1 tablet by mouth daily at 12 noon.    . Probiotic Product (PROBIOTIC DAILY PO) Take 1 tablet by mouth daily.    . sertraline (ZOLOFT) 100 MG tablet Take 1.5 tablets (150 mg total) by mouth daily. MUST SCHEDULE ANNUAL EXAM FOR FURTHER REFILLS 45 tablet 5  . simvastatin (ZOCOR) 20 MG tablet Take 1 tablet (20 mg total) by mouth at bedtime. 30 tablet 2   No current facility-administered medications for this visit.     Allergies  Allergen Reactions  . Flagyl [Metronidazole] Hives and Nausea Only     Family History  Problem Relation Age of Onset  . Prostate cancer Father   . Hyperlipidemia Father   . Cancer Father     prostate  . Heart disease Father   . Diabetes Neg Hx   . Stroke Neg Hx     Social History   Social History  . Marital status: Married    Spouse name: N/A  . Number of children: N/A  . Years of education: N/A   Occupational History  . Not on file.   Social History Main Topics  . Smoking status: Never Smoker  . Smokeless tobacco: Never Used  . Alcohol use No  . Drug use: No  . Sexual activity: Yes    Birth control/ protection: IUD   Other Topics Concern  . Not on file   Social History Narrative  . No narrative on file     Constitutional: Pt reports headache. Denies fever, malaise, fatigue, or abrupt weight changes.  HEENT: Pt reports nasal congestion and sore throat. Denies eye pain, eye redness, ear pain, ringing in the ears, wax buildup, runny nose, bloody noset. Respiratory: Denies difficulty breathing, shortness of breath, cough or sputum production.   Cardiovascular: Denies chest pain, chest tightness, palpitations or swelling in the hands or feet.  Gastrointestinal: Pt reports increased thirst. Denies abdominal pain, bloating, constipation, diarrhea or blood in the stool.  GU: Denies urgency, frequency, pain with urination, burning  sensation, blood in urine, odor or discharge. Musculoskeletal: Denies decrease in range of motion, difficulty with gait, muscle pain or joint pain and swelling.  Skin: Pt reports sweatiness. Denies redness, rashes, lesions or ulcercations.  Neurological: Pt reports jitteriness and lightheadedness. Denies dizziness, difficulty with memory, difficulty with speech or problems with balance and coordination.    No other specific complaints in a complete review of systems (except as listed in HPI above).     Objective:   Physical Exam   BP 110/76   Pulse 72   Temp 98.7 F (37.1 C) (Oral)   Wt 164 lb (74.4  kg)   SpO2 98%   BMI 29.05 kg/m  Wt Readings from Last 3 Encounters:  01/26/16 164 lb (74.4 kg)  01/09/16 155 lb 12 oz (70.6 kg)  03/21/15 151 lb (68.5 kg)    General: Appears her stated age, well developed, well nourished in NAD. Skin: Warm, dry and intact.  HEENT: Head: normal shape and size, no sinus tenderness noted; Ears: Tm's pink but intact, normal light reflex, + serous effusion bilaterally; Nose: mucosa boggy and moist, septum midline; Throat/Mouth: Teeth present, mucosa pink and moist, + PND, no exudate, lesions or ulcerations noted.  Neck:  No adenopathy noted. Cardiovascular: Normal rate and rhythm. S1,S2 noted.   Pulmonary/Chest: Normal effort and positive vesicular breath sounds. No respiratory distress. No wheezes, rales or ronchi noted.  Abdomen: Soft and nontender. Normal bowel sounds.  Neurological: Alert and oriented.   BMET    Component Value Date/Time   NA 137 01/09/2016 1521   K 4.0 01/09/2016 1521   CL 103 01/09/2016 1521   CO2 30 01/09/2016 1521   GLUCOSE 101 (H) 01/09/2016 1521   BUN 19 01/09/2016 1521   CREATININE 0.78 01/09/2016 1521   CALCIUM 9.2 01/09/2016 1521    Lipid Panel     Component Value Date/Time   CHOL 234 (H) 01/09/2016 1521   TRIG 355.0 (H) 01/09/2016 1521   HDL 33.50 (L) 01/09/2016 1521   CHOLHDL 7 01/09/2016 1521   VLDL 71.0 (H) 01/09/2016 1521    CBC    Component Value Date/Time   WBC 14.4 (H) 09/26/2013 0618   RBC 3.07 (L) 09/26/2013 0618   HGB 9.5 (L) 09/26/2013 0618   HCT 28.8 (L) 09/26/2013 0618   PLT 153 09/26/2013 0618   MCV 93.8 09/26/2013 0618   MCH 30.9 09/26/2013 0618   MCHC 33.0 09/26/2013 0618   RDW 14.7 09/26/2013 0618    Hgb A1C No results found for: HGBA1C         Assessment & Plan:   Viral Sinusitis:  No indication for abx at this time Start Zyrtec and Flonase OTC Return precautions discussed  Jittery, lightheaded, sweaty, increased thirst:  Discussed the importance of eating small,  frequent meals Discussed the proper nutrition to prevent blood sugar spikes and to prevent hypoglycemia Will check A1C, TSH and free insulin levels  Will follow up after labs, RTC as needed Nicki Reaper, NP

## 2016-01-26 NOTE — Patient Instructions (Signed)

## 2016-01-31 LAB — INSULIN ANTIBODIES, BLOOD: Insulin Antibodies, Human: <0.4 U/mL (ref <0.4)

## 2016-03-05 ENCOUNTER — Encounter: Payer: Self-pay | Admitting: Family Medicine

## 2016-03-05 ENCOUNTER — Ambulatory Visit (INDEPENDENT_AMBULATORY_CARE_PROVIDER_SITE_OTHER): Payer: BC Managed Care – PPO | Admitting: Family Medicine

## 2016-03-05 DIAGNOSIS — J0100 Acute maxillary sinusitis, unspecified: Secondary | ICD-10-CM | POA: Diagnosis not present

## 2016-03-05 DIAGNOSIS — J019 Acute sinusitis, unspecified: Secondary | ICD-10-CM | POA: Insufficient documentation

## 2016-03-05 MED ORDER — AMOXICILLIN 500 MG PO CAPS: 1000.0000 mg | ORAL_CAPSULE | Freq: Two times a day (BID) | ORAL | 0 refills | Status: DC

## 2016-03-05 NOTE — Assessment & Plan Note (Signed)
>   10 days of symptoms not improving with decongestant.. Pain is mainly unilateral right sinus.  Will treat with antibiotics , nasal saline irrigation, nasal steroid and mucolytic.

## 2016-03-05 NOTE — Progress Notes (Signed)
Pre visit review using our clinic review tool, if applicable. No additional management support is needed unless otherwise documented below in the visit note. 

## 2016-03-05 NOTE — Progress Notes (Signed)
   Subjective:    Patient ID: Tanya Miller, female    DOB: 10/08/1975, 41 y.o.   MRN: [id number]  Sinusitis  This is a new problem. The current episode started 1 to 4 weeks ago (1.5 weeks). The problem has been gradually worsening since onset. There has been no fever. Associated symptoms include congestion, coughing, headaches, sinus pressure and a sore throat. Pertinent negatives include no ear pain, neck pain, shortness of breath, sneezing or swollen glands. (Post nasal drip) Past treatments include oral decongestants (nyquil, ibuprofen, sudafed for 4-5 days). The treatment provided mild relief.     PMH:  Hx of sinusitis, No past sinus surgery  No allergies.   No sick contacts.  Review of Systems  HENT: Positive for congestion, sinus pressure and sore throat. Negative for ear pain and sneezing.   Respiratory: Positive for cough. Negative for shortness of breath.   Musculoskeletal: Negative for neck pain.  Neurological: Positive for headaches.       Objective:   Physical Exam  Constitutional: Vital signs are normal. She appears well-developed and well-nourished. She is cooperative.  Non-toxic appearance. She does not appear ill. No distress.  HENT:  Head: Normocephalic.  Right Ear: Hearing, external ear and ear canal normal. Tympanic membrane is not erythematous, not retracted and not bulging. A middle ear effusion is present.  Left Ear: Hearing, external ear and ear canal normal. Tympanic membrane is not erythematous, not retracted and not bulging. A middle ear effusion is present.  Nose: Mucosal edema and rhinorrhea present. Right sinus exhibits maxillary sinus tenderness. Right sinus exhibits no frontal sinus tenderness. Left sinus exhibits no maxillary sinus tenderness and no frontal sinus tenderness.  Mouth/Throat: Uvula is midline, oropharynx is clear and moist and mucous membranes are normal.  Eyes: Conjunctivae, EOM and lids are normal. Pupils are equal, round, and  reactive to light. Lids are everted and swept, no foreign bodies found.  Neck: Trachea normal and normal range of motion. Neck supple. Carotid bruit is not present. No thyroid mass and no thyromegaly present.  Cardiovascular: Normal rate, regular rhythm, S1 normal, S2 normal, normal heart sounds, intact distal pulses and normal pulses.  Exam reveals no gallop and no friction rub.   No murmur heard. Pulmonary/Chest: Effort normal and breath sounds normal. No tachypnea. No respiratory distress. She has no decreased breath sounds. She has no wheezes. She has no rhonchi. She has no rales.  Neurological: She is alert.  Skin: Skin is warm, dry and intact. No rash noted.  Psychiatric: Her speech is normal and behavior is normal. Judgment normal. Her mood appears not anxious. Cognition and memory are normal. She does not exhibit a depressed mood.          Assessment & Plan:

## 2016-03-05 NOTE — Patient Instructions (Signed)
Start nasal saline spray 2-3 times daily. Can consider nasal steroid spray like flonase.  Start mucinex (guafenesin) twice daily.  Complete a course of antibiotics.  Rest, fluids.

## 2016-04-12 ENCOUNTER — Other Ambulatory Visit: Payer: Self-pay | Admitting: Obstetrics and Gynecology

## 2016-04-12 HISTORY — PX: HYSTEROSCOPY WITH D & C: SHX1775

## 2016-04-27 ENCOUNTER — Telehealth: Payer: Self-pay | Admitting: Internal Medicine

## 2016-04-27 NOTE — Telephone Encounter (Signed)
Pt is requesting a 1 time waiver for 12/8 no show fee. She would like a cb to discuss.

## 2016-07-19 NOTE — Telephone Encounter (Signed)
yes

## 2016-07-19 NOTE — Telephone Encounter (Signed)
I see in the appt notes that Regina wantRene Kochered to keep pt on the schedule.  Pt was informed at the time of cancellation that there would be a fee.  Pt cancelled appt at 2 for a 2:15appt. Rene KocherRegina - Do you want to charge the fee?

## 2017-05-02 HISTORY — PX: ARTHROSCOPY WITH ANTERIOR CRUCIATE LIGAMENT (ACL) REPAIR WITH ANTERIOR TIBILIAS GRAFT: SHX6503

## 2017-11-07 ENCOUNTER — Other Ambulatory Visit: Payer: Self-pay | Admitting: Obstetrics and Gynecology

## 2017-11-07 DIAGNOSIS — R928 Other abnormal and inconclusive findings on diagnostic imaging of breast: Secondary | ICD-10-CM

## 2017-11-11 ENCOUNTER — Ambulatory Visit
Admission: RE | Admit: 2017-11-11 | Discharge: 2017-11-11 | Disposition: A | Discharge status: Home / Self Care | Payer: BC Managed Care – PPO | Source: Ambulatory Visit | Attending: Obstetrics and Gynecology | Admitting: Obstetrics and Gynecology

## 2017-11-11 ENCOUNTER — Ambulatory Visit
Admission: RE | Admit: 2017-11-11 | Discharge: 2017-11-11 | Disposition: A | Discharge status: Home / Self Care | Payer: Managed Care, Other (non HMO) | Source: Ambulatory Visit | Attending: Obstetrics and Gynecology | Admitting: Obstetrics and Gynecology

## 2017-11-11 DIAGNOSIS — R928 Other abnormal and inconclusive findings on diagnostic imaging of breast: Secondary | ICD-10-CM

## 2018-08-10 ENCOUNTER — Other Ambulatory Visit: Payer: Self-pay | Admitting: Obstetrics and Gynecology

## 2018-08-10 HISTORY — PX: VAGINAL HYSTERECTOMY: SUR661

## 2020-03-12 ENCOUNTER — Other Ambulatory Visit: Payer: Self-pay | Admitting: Gastroenterology

## 2020-03-12 DIAGNOSIS — R1011 Right upper quadrant pain: Secondary | ICD-10-CM

## 2020-03-12 DIAGNOSIS — R112 Nausea with vomiting, unspecified: Secondary | ICD-10-CM

## 2020-03-12 DIAGNOSIS — R1013 Epigastric pain: Secondary | ICD-10-CM

## 2020-03-21 ENCOUNTER — Other Ambulatory Visit (HOSPITAL_COMMUNITY): Payer: Self-pay | Admitting: Gastroenterology

## 2020-03-21 ENCOUNTER — Other Ambulatory Visit: Payer: Self-pay | Admitting: Gastroenterology

## 2020-03-21 ENCOUNTER — Other Ambulatory Visit: Payer: Self-pay

## 2020-03-21 ENCOUNTER — Ambulatory Visit
Admission: RE | Admit: 2020-03-21 | Discharge: 2020-03-21 | Disposition: A | Discharge status: Home / Self Care | Payer: Managed Care, Other (non HMO) | Source: Ambulatory Visit | Attending: Gastroenterology | Admitting: Gastroenterology

## 2020-03-21 DIAGNOSIS — R1013 Epigastric pain: Secondary | ICD-10-CM | POA: Insufficient documentation

## 2020-03-21 DIAGNOSIS — R112 Nausea with vomiting, unspecified: Secondary | ICD-10-CM | POA: Insufficient documentation

## 2020-03-21 DIAGNOSIS — R1011 Right upper quadrant pain: Secondary | ICD-10-CM | POA: Diagnosis present

## 2020-03-21 DIAGNOSIS — R161 Splenomegaly, not elsewhere classified: Secondary | ICD-10-CM

## 2020-04-02 ENCOUNTER — Ambulatory Visit: Payer: Managed Care, Other (non HMO)

## 2020-04-10 ENCOUNTER — Other Ambulatory Visit: Payer: Self-pay

## 2020-04-10 ENCOUNTER — Ambulatory Visit
Admission: RE | Admit: 2020-04-10 | Discharge: 2020-04-10 | Disposition: A | Discharge status: Home / Self Care | Payer: Managed Care, Other (non HMO) | Source: Ambulatory Visit | Attending: Gastroenterology | Admitting: Gastroenterology

## 2020-04-10 DIAGNOSIS — R1011 Right upper quadrant pain: Secondary | ICD-10-CM | POA: Diagnosis present

## 2020-04-10 DIAGNOSIS — R161 Splenomegaly, not elsewhere classified: Secondary | ICD-10-CM | POA: Diagnosis not present

## 2020-04-10 DIAGNOSIS — R1013 Epigastric pain: Secondary | ICD-10-CM | POA: Diagnosis present

## 2020-04-10 MED ORDER — IOHEXOL 300 MG/ML  SOLN
100.0000 mL | Freq: Once | INTRAMUSCULAR | Status: AC | PRN
  Administered 2020-04-10: 100 mL via INTRAVENOUS

## 2020-11-18 ENCOUNTER — Other Ambulatory Visit: Payer: Self-pay | Admitting: Urology

## 2020-12-08 ENCOUNTER — Encounter (HOSPITAL_BASED_OUTPATIENT_CLINIC_OR_DEPARTMENT_OTHER): Payer: Self-pay | Admitting: Urology

## 2020-12-08 ENCOUNTER — Other Ambulatory Visit: Payer: Self-pay

## 2020-12-08 NOTE — Progress Notes (Signed)
Spoke w/ via phone for pre-op interview---pt Lab needs dos---- no              Lab results------ no COVID test -----patient states asymptomatic no test needed Arrive at ------- 0530 on 12-16-2020 NPO after MN NO Solid Food.  Clear liquids from MN until--- 0430 Med rec completed Medications to take morning of surgery ----- prilosec, estradiol, lexapro Diabetic medication ----- n/a Patient instructed no nail polish to be worn day of surgery Patient instructed to bring photo id and insurance card day of surgery Patient aware to have Driver (ride ) / caregiver  for 24 hours after surgery --husband, Tanya Miller Patient Special Instructions ----- n/a Pre-Op special Istructions ----- n/a Patient verbalized understanding of instructions that were given at this phone interview. Patient denies shortness of breath, chest pain, fever, cough at this phone interview.

## 2020-12-09 NOTE — H&P (Signed)
Patient had bulking agent treatment March 12th with excellent coaptation. She was having mild stress incontinence affecting her quality life not wearing a pad. She had the 1st treatment February 27, 2019. She had the top up after because she was still leak a few drops if she jumped to hard. We had talked about physical therapy and a sling relative to symptoms severity prior to the treatments.   The patient came in today for probable bladder infection. She was having pressure in the suprapubic area over the weekend with blood in the urine and some burning increased frequency. She took Azo-Standard and other home therapies. She does not normally get bladder infections.   She said the bulking agent did not make her completely dry   The patient clinically has a bladder infection but her urine looked normal. There is no blood in the urine. I sent urine for culture. It would be odd for urethral erosion of the bulking agent to give such acute symptoms. I thought I would give her an antibiotic and have her come back in 2 weeks for reassessment. Call if urine culture defers. I did not cystoscoped her today   Acute symptoms are improving but she thinks it might be coming back. Called and ciprofloxacin 250 mg twice a da. I spoke to her today about the differential diagnosis y for 1 week and regroup in about 2 weeks. She has always had very high treatment goals with the bulking agent. She has had this treatment helped a little bit but not a lot   TOday  Frequency stable. patient had a recent CT scan which demonstrated small nonobstructing stones 2-3 mm in the right kidney.  Patient is asymptomatic. She was getting 1 week episodes of nausea and vomiting and was found to have a mildly enlarged spleen but no gallbladder issues. Picture drawn. Watchful waiting for small stones. Fluid modifications discussed  Still has stress incontinence with jumping on a trampoline and coughing sneezing. She is dry the majority of days  and does not wear a pad. Her gynecologist was encouraging and she thought she would proceed with the sling. We went over the pros and cons and risks without a diagram today and she remember them very well. I strongly recommended physical therapy. I said I would do the sling but I honestly felt that her degree of incontinence that I normally would not proceed with the sling. She understands the things can worsen in understands complications. She will call if she like to proceed. Otherwise I will see her p.r.n.. No more blood in urine or cystitis symotms   she she has been very well counseled and realizes again that surgery often does not perfect. No microscopic hematuria or signs of infection today     ALLERGIES: Flagyl - Nausea, Hives    MEDICATIONS: Omeprazole 40 mg capsule,delayed release  Sertraline Hcl 100 mg tablet     GU PSH: Complex cystometrogram, w/ void pressure and urethral pressure profile studies, any technique - 02/07/2019 Complex Uroflow - 02/07/2019 Cystoscopy Collagen Injection - 03/30/2019, 02/27/2019 Emg surf Electrd - 02/07/2019 Hysterectomy - 2020 Inject For cystogram - 02/07/2019 Intrabd voidng Press - 02/07/2019     NON-GU PSH: Injectable Bulking Agent, Synthetic Implant, Urinary Tract, 1 Ml Syringe, Includes Shipping And Nece - 03/30/2019, 02/27/2019     GU PMH: Urinary Tract Inf, Unspec site - 06/12/2019 Nocturia - 01/26/2019 Stress Incontinence - 01/26/2019 Urinary Frequency - 01/26/2019    NON-GU PMH: Anxiety GERD Hypercholesterolemia  FAMILY HISTORY: 2 sons - Son 3 daughters - Daughter Kidney Failure - Father kidney stone - Father Patient's father is still living - Father patient's mother is still living - Mother Prostate Cancer - Father   SOCIAL HISTORY: Marital Status: Married Current Smoking Status: Patient has never smoked.   Tobacco Use Assessment Completed: Used Tobacco in last 30 days? Does not use smokeless tobacco. Has never drank.  Drinks 2  caffeinated drinks per day.    REVIEW OF SYSTEMS:    GU Review Female:   Patient reports hard to postpone urination and leakage of urine. Patient denies frequent urination, burning /pain with urination, get up at night to urinate, stream starts and stops, trouble starting your stream, have to strain to urinate, and being pregnant.  Gastrointestinal (Upper):   Patient reports nausea and vomiting. Patient denies indigestion/ heartburn.  Gastrointestinal (Lower):   Patient denies diarrhea and constipation.  Constitutional:   Patient reports weight loss. Patient denies fever, night sweats, and fatigue.  Skin:   Patient denies skin rash/ lesion and itching.  Eyes:   Patient denies blurred vision and double vision.  Ears/ Nose/ Throat:   Patient denies sore throat and sinus problems.  Hematologic/Lymphatic:   Patient denies swollen glands and easy bruising.  Cardiovascular:   Patient denies leg swelling and chest pains.  Respiratory:   Patient denies cough and shortness of breath.  Endocrine:   Patient denies excessive thirst.  Musculoskeletal:   Patient reports back pain. Patient denies joint pain.  Neurological:   Patient denies headaches and dizziness.  Psychologic:   Patient denies anxiety and depression.   VITAL SIGNS: None   PAST DATA REVIEW: None   PROCEDURES:          Urinalysis Dipstick Dipstick Cont'd  Color: Yellow Bilirubin: Neg mg/dL  Appearance: Clear Ketones: Neg mg/dL  Specific Gravity: 6.283 Blood: Neg ery/uL  pH: 5.5 Protein: Neg mg/dL  Glucose: Neg mg/dL Urobilinogen: 0.2 mg/dL    Nitrites: Neg    Leukocyte Esterase: Neg leu/uL    ASSESSMENT:      ICD-10 Details  1 GU:   Nocturia - R35.1   2   Stress Incontinence - N39.3      PLAN:           Schedule Return Visit/Planned Activity: Return PRN

## 2020-12-15 NOTE — Anesthesia Preprocedure Evaluation (Addendum)
Anesthesia Evaluation  Patient identified by MRN, date of birth, ID band Patient awake    Reviewed: Allergy & Precautions, NPO status , Patient's Chart, lab work & pertinent test results  History of Anesthesia Complications Negative for: history of anesthetic complications  Airway Mallampati: I  TM Distance: >3 FB Neck ROM: Full    Dental  (+) Dental Advisory Given, Teeth Intact   Pulmonary neg pulmonary ROS,    Pulmonary exam normal        Cardiovascular negative cardio ROS Normal cardiovascular exam     Neuro/Psych PSYCHIATRIC DISORDERS Anxiety Depression negative neurological ROS     GI/Hepatic Neg liver ROS, GERD  Medicated and Controlled,  Endo/Other  negative endocrine ROS  Renal/GU negative Renal ROS     Musculoskeletal negative musculoskeletal ROS (+)   Abdominal   Peds  Hematology negative hematology ROS (+)   Anesthesia Other Findings   Reproductive/Obstetrics  s/p hysterectomy                             Anesthesia Physical Anesthesia Plan  ASA: 2  Anesthesia Plan: General   Post-op Pain Management: Tylenol PO (pre-op) and Celebrex PO (pre-op)   Induction: Intravenous  PONV Risk Score and Plan: 3 and Ondansetron, Dexamethasone, Midazolam, Treatment may vary due to age or medical condition and Scopolamine patch - Pre-op  Airway Management Planned: LMA  Additional Equipment: None  Intra-op Plan:   Post-operative Plan: Extubation in OR  Informed Consent: I have reviewed the patients History and Physical, chart, labs and discussed the procedure including the risks, benefits and alternatives for the proposed anesthesia with the patient or authorized representative who has indicated his/her understanding and acceptance.     Dental advisory given  Plan Discussed with: CRNA and Anesthesiologist  Anesthesia Plan Comments:        Anesthesia Quick  Evaluation

## 2020-12-16 ENCOUNTER — Encounter (HOSPITAL_BASED_OUTPATIENT_CLINIC_OR_DEPARTMENT_OTHER): Payer: Self-pay | Admitting: Urology

## 2020-12-16 ENCOUNTER — Ambulatory Visit (HOSPITAL_BASED_OUTPATIENT_CLINIC_OR_DEPARTMENT_OTHER)
Admission: RE | Admit: 2020-12-16 | Discharge: 2020-12-16 | Disposition: A | Discharge status: Home / Self Care | Payer: Managed Care, Other (non HMO) | Attending: Urology | Admitting: Urology

## 2020-12-16 ENCOUNTER — Ambulatory Visit (HOSPITAL_BASED_OUTPATIENT_CLINIC_OR_DEPARTMENT_OTHER): Payer: Managed Care, Other (non HMO) | Admitting: Anesthesiology

## 2020-12-16 ENCOUNTER — Other Ambulatory Visit: Payer: Self-pay

## 2020-12-16 ENCOUNTER — Encounter (HOSPITAL_BASED_OUTPATIENT_CLINIC_OR_DEPARTMENT_OTHER)
Admission: RE | Disposition: A | Discharge status: Home / Self Care | Payer: Self-pay | Source: Home / Self Care | Attending: Urology

## 2020-12-16 DIAGNOSIS — N393 Stress incontinence (female) (male): Secondary | ICD-10-CM | POA: Diagnosis not present

## 2020-12-16 DIAGNOSIS — K219 Gastro-esophageal reflux disease without esophagitis: Secondary | ICD-10-CM | POA: Diagnosis not present

## 2020-12-16 DIAGNOSIS — F418 Other specified anxiety disorders: Secondary | ICD-10-CM | POA: Insufficient documentation

## 2020-12-16 HISTORY — DX: Presence of spectacles and contact lenses: Z97.3

## 2020-12-16 HISTORY — PX: PUBOVAGINAL SLING: SHX1035

## 2020-12-16 HISTORY — DX: Gastro-esophageal reflux disease without esophagitis: K21.9

## 2020-12-16 HISTORY — DX: Generalized anxiety disorder: F41.1

## 2020-12-16 HISTORY — DX: Major depressive disorder, single episode, unspecified: F32.9

## 2020-12-16 SURGERY — CREATION, PUBOVAGINAL SLING
Anesthesia: General | Site: Vagina

## 2020-12-16 MED ORDER — ONDANSETRON HCL 4 MG/2ML IJ SOLN
INTRAMUSCULAR | Status: DC | PRN
  Administered 2020-12-16: 4 mg via INTRAVENOUS

## 2020-12-16 MED ORDER — DEXMEDETOMIDINE (PRECEDEX) IN NS 20 MCG/5ML (4 MCG/ML) IV SYRINGE
PREFILLED_SYRINGE | INTRAVENOUS | Status: DC | PRN
  Administered 2020-12-16: 8 ug via INTRAVENOUS
  Administered 2020-12-16: 4 ug via INTRAVENOUS

## 2020-12-16 MED ORDER — PROPOFOL 10 MG/ML IV BOLUS
INTRAVENOUS | Status: DC | PRN
  Administered 2020-12-16: 200 mg via INTRAVENOUS
  Administered 2020-12-16: 100 mg via INTRAVENOUS
  Administered 2020-12-16: 50 mg via INTRAVENOUS

## 2020-12-16 MED ORDER — PROMETHAZINE HCL 25 MG/ML IJ SOLN
6.2500 mg | INTRAMUSCULAR | Status: DC | PRN
Start: 2020-12-16 — End: 2020-12-16

## 2020-12-16 MED ORDER — PHENAZOPYRIDINE HCL 100 MG PO TABS
ORAL_TABLET | ORAL | Status: AC
  Filled 2020-12-16: qty 2

## 2020-12-16 MED ORDER — CELECOXIB 200 MG PO CAPS
ORAL_CAPSULE | ORAL | Status: AC
  Filled 2020-12-16: qty 1

## 2020-12-16 MED ORDER — CEFAZOLIN SODIUM-DEXTROSE 2-4 GM/100ML-% IV SOLN
INTRAVENOUS | Status: AC
  Filled 2020-12-16: qty 100

## 2020-12-16 MED ORDER — CIPROFLOXACIN IN D5W 400 MG/200ML IV SOLN
INTRAVENOUS | Status: AC
  Filled 2020-12-16: qty 200

## 2020-12-16 MED ORDER — SODIUM CHLORIDE 0.9 % IR SOLN
Status: DC | PRN
  Administered 2020-12-16 (×2): 1000 mL via INTRAVESICAL

## 2020-12-16 MED ORDER — MIDAZOLAM HCL 5 MG/5ML IJ SOLN
INTRAMUSCULAR | Status: DC | PRN
  Administered 2020-12-16: 2 mg via INTRAVENOUS

## 2020-12-16 MED ORDER — PHENAZOPYRIDINE HCL 100 MG PO TABS
200.0000 mg | ORAL_TABLET | ORAL | Status: AC
  Administered 2020-12-16: 200 mg via ORAL

## 2020-12-16 MED ORDER — LIDOCAINE 2% (20 MG/ML) 5 ML SYRINGE
INTRAMUSCULAR | Status: DC | PRN
  Administered 2020-12-16: 60 mg via INTRAVENOUS

## 2020-12-16 MED ORDER — ACETAMINOPHEN 500 MG PO TABS
1000.0000 mg | ORAL_TABLET | Freq: Once | ORAL | Status: AC
  Administered 2020-12-16: 1000 mg via ORAL

## 2020-12-16 MED ORDER — ESTRADIOL 0.1 MG/GM VA CREA
TOPICAL_CREAM | VAGINAL | Status: DC | PRN
  Administered 2020-12-16: 1 via VAGINAL

## 2020-12-16 MED ORDER — PHENYLEPHRINE 40 MCG/ML (10ML) SYRINGE FOR IV PUSH (FOR BLOOD PRESSURE SUPPORT)
PREFILLED_SYRINGE | INTRAVENOUS | Status: DC | PRN
  Administered 2020-12-16 (×2): 80 ug via INTRAVENOUS
  Administered 2020-12-16: 120 ug via INTRAVENOUS
  Administered 2020-12-16: 80 ug via INTRAVENOUS

## 2020-12-16 MED ORDER — LIDOCAINE-EPINEPHRINE (PF) 1 %-1:200000 IJ SOLN
INTRAMUSCULAR | Status: DC | PRN
  Administered 2020-12-16: 8 mL

## 2020-12-16 MED ORDER — LACTATED RINGERS IV SOLN: INTRAVENOUS | Status: DC

## 2020-12-16 MED ORDER — CIPROFLOXACIN IN D5W 400 MG/200ML IV SOLN
400.0000 mg | INTRAVENOUS | Status: AC
  Administered 2020-12-16: 400 mg via INTRAVENOUS

## 2020-12-16 MED ORDER — FENTANYL CITRATE (PF) 100 MCG/2ML IJ SOLN: 25.0000 ug | INTRAMUSCULAR | Status: DC | PRN

## 2020-12-16 MED ORDER — FENTANYL CITRATE (PF) 100 MCG/2ML IJ SOLN
INTRAMUSCULAR | Status: DC | PRN
  Administered 2020-12-16 (×2): 50 ug via INTRAVENOUS

## 2020-12-16 MED ORDER — STERILE WATER FOR IRRIGATION IR SOLN
Status: DC | PRN
  Administered 2020-12-16: 500 mL

## 2020-12-16 MED ORDER — SULFAMETHOXAZOLE-TRIMETHOPRIM 400-80 MG PO TABS: 1.0000 | ORAL_TABLET | Freq: Two times a day (BID) | ORAL | 0 refills | Status: AC

## 2020-12-16 MED ORDER — CELECOXIB 200 MG PO CAPS
200.0000 mg | ORAL_CAPSULE | Freq: Once | ORAL | Status: AC
  Administered 2020-12-16: 200 mg via ORAL

## 2020-12-16 MED ORDER — 0.9 % SODIUM CHLORIDE (POUR BTL) OPTIME
TOPICAL | Status: DC | PRN
  Administered 2020-12-16: 500 mL

## 2020-12-16 MED ORDER — OXYCODONE HCL 5 MG PO TABS: 5.0000 mg | ORAL_TABLET | Freq: Once | ORAL | Status: DC | PRN

## 2020-12-16 MED ORDER — DEXAMETHASONE SODIUM PHOSPHATE 4 MG/ML IJ SOLN
INTRAMUSCULAR | Status: DC | PRN
  Administered 2020-12-16: 10 mg via INTRAVENOUS

## 2020-12-16 MED ORDER — ACETAMINOPHEN 500 MG PO TABS
ORAL_TABLET | ORAL | Status: AC
  Filled 2020-12-16: qty 2

## 2020-12-16 MED ORDER — HYDROCODONE-ACETAMINOPHEN 5-325 MG PO TABS: 1.0000 | ORAL_TABLET | Freq: Four times a day (QID) | ORAL | 0 refills | Status: AC | PRN

## 2020-12-16 MED ORDER — CEFAZOLIN SODIUM-DEXTROSE 2-4 GM/100ML-% IV SOLN
2.0000 g | INTRAVENOUS | Status: AC
  Administered 2020-12-16: 2 g via INTRAVENOUS

## 2020-12-16 MED ORDER — OXYCODONE HCL 5 MG/5ML PO SOLN: 5.0000 mg | Freq: Once | ORAL | Status: DC | PRN

## 2020-12-16 MED ORDER — PHENAZOPYRIDINE HCL 100 MG PO TABS
ORAL_TABLET | ORAL | Status: AC
  Filled 2020-12-16: qty 1

## 2020-12-16 SURGICAL SUPPLY — 41 items
ADH SKN CLS APL DERMABOND .7 (GAUZE/BANDAGES/DRESSINGS) ×1
BAG DRN RND TRDRP ANRFLXCHMBR (UROLOGICAL SUPPLIES)
BAG URINE DRAIN 2000ML AR STRL (UROLOGICAL SUPPLIES) IMPLANT
BLADE CLIPPER SENSICLIP SURGIC (BLADE) ×2 IMPLANT
BLADE SURG 15 STRL LF DISP TIS (BLADE) ×1 IMPLANT
BLADE SURG 15 STRL SS (BLADE) ×2
CANISTER SUCT 1200ML W/VALVE (MISCELLANEOUS) ×2 IMPLANT
CATH FOLEY 2WAY SLVR  5CC 14FR (CATHETERS) ×2
CATH FOLEY 2WAY SLVR 5CC 14FR (CATHETERS) ×1 IMPLANT
COVER MAYO STAND STRL (DRAPES) ×4 IMPLANT
DERMABOND ADVANCED (GAUZE/BANDAGES/DRESSINGS) ×1
DERMABOND ADVANCED .7 DNX12 (GAUZE/BANDAGES/DRESSINGS) ×1 IMPLANT
DRAIN PENROSE 0.25X18 (DRAIN) ×2 IMPLANT
GAUZE 4X4 16PLY ~~LOC~~+RFID DBL (SPONGE) ×6 IMPLANT
GLOVE SURG ENC MOIS LTX SZ7.5 (GLOVE) ×4 IMPLANT
GOWN STRL REUS W/TWL LRG LVL3 (GOWN DISPOSABLE) ×2 IMPLANT
HOLDER FOLEY CATH W/STRAP (MISCELLANEOUS) ×2 IMPLANT
KIT TURNOVER CYSTO (KITS) ×2 IMPLANT
MANIFOLD NEPTUNE II (INSTRUMENTS) IMPLANT
NEEDLE HYPO 22GX1.5 SAFETY (NEEDLE) ×2 IMPLANT
PACK VAGINAL WOMENS (CUSTOM PROCEDURE TRAY) ×2 IMPLANT
PACKING VAGINAL (PACKING) ×2 IMPLANT
PAK SCROTO (SET/KITS/TRAYS/PACK) ×2 IMPLANT
PLUG CATH AND CAP STER (CATHETERS) ×2 IMPLANT
RETRACTOR STAY HOOK 5MM (MISCELLANEOUS) IMPLANT
SET IRRIG Y TYPE TUR BLADDER L (SET/KITS/TRAYS/PACK) ×2 IMPLANT
SHEET LAVH (DRAPES) ×2 IMPLANT
SLING SUPRIS RETROPUBIC KIT (Miscellaneous) ×2 IMPLANT
SURGILUBE 2OZ TUBE FLIPTOP (MISCELLANEOUS) ×2 IMPLANT
SUT SILK 2 0 SH (SUTURE) IMPLANT
SUT VIC AB 2-0 CT1 27 (SUTURE) ×4
SUT VIC AB 2-0 CT1 TAPERPNT 27 (SUTURE) ×2 IMPLANT
SUT VIC AB 2-0 SH 27 (SUTURE)
SUT VIC AB 2-0 SH 27XBRD (SUTURE) IMPLANT
SUT VIC AB 4-0 PS2 18 (SUTURE) ×2 IMPLANT
SYR 10ML LL (SYRINGE) ×2 IMPLANT
SYR BULB IRRIG 60ML STRL (SYRINGE) ×2 IMPLANT
TOWEL OR 17X26 10 PK STRL BLUE (TOWEL DISPOSABLE) ×2 IMPLANT
TUBE CONNECTING 12X1/4 (SUCTIONS) ×2 IMPLANT
WATER STERILE IRR 3000ML UROMA (IV SOLUTION) ×2 IMPLANT
WATER STERILE IRR 500ML POUR (IV SOLUTION) ×2 IMPLANT

## 2020-12-16 NOTE — Interval H&P Note (Signed)
History and Physical Interval Note:  12/16/2020 7:04 AM  [name]  has presented today for surgery, with the diagnosis of STRESS,INCONTINENCE.  The various methods of treatment have been discussed with the patient and family. After consideration of risks, benefits and other options for treatment, the patient has consented to  Procedure(s): CYSTOSCOPY PUBO-VAGINAL SLING (N/A) as a surgical intervention.  The patient's history has been reviewed, patient examined, no change in status, stable for surgery.  I have reviewed the patient's chart and labs.  Questions were answered to the patient's satisfaction.     Tanya Miller A Lincoln Kleiner

## 2020-12-16 NOTE — Op Note (Signed)
Preoperative diagnosis: Stress urinary incontinence Postoperative diagnosis: Stress urinary incontinence Surgery: Sling cystourethropexy plus cystoscopy Surgeon: Dr. Lorin Picket Dejuana Weist  The patient has the above diagnosis and consented to the above procedure.  The complexity of urethra has been previously described and I was using different techniques to minimize urethral manipulation.  Preoperative antibiotics were given.  Extra care was taken with leg positioning to minimize the risk of compartment syndrome neuropathy and deep vein thrombosis.  I initially cystoscoped the patient with a well-lubricated flexible scope.  Urethra looked healthy.  I could see the frondular material at 5:00 just inside the bladder neck and urethra as I did in the office with just a few cell layers overlying previous bulking agent.  It was not bleeding.  It looked healthy.  It was close to the proximal third of the urethra and near the bladder neck that would potentially make it more readily traumatized.  Trigone and bladder was normal.  A well-lubricated 14 French catheter was gently inserted  I made two 1 cm incisions 1 fingerbreadth above the symphysis pubis 1.5 cm lateral to the midline in the area of her previous incisions.  I was very careful in making a 2 cm suburethral incision in the midline.  It was marked with marking pen.  There was a lot of redundancy in length to the actual epithelium but I was very careful.  It was made appropriate depth after instilling 3 cc of lidocaine epinephrine mixture.  With scissors I developed a flap with sharp dissection then was spreading maneuver exposing urethrovesical angle bilaterally.  I was very careful to gently palpate the urethrovesical angle bilaterally without compressing the urethra and Foley catheter.  With the bladder empty and breaths quiet I passed a trocar on top of and along the back and symphysis pubis onto the pulp my index finger bilaterally.  I used my box  technique. I double checked and the trochars again were well away from the urethra.  I gently recystoscoped the patient.  There was no trocar in bladder.  Bladder and trigone and ureters were normal with no injury.  The urethral findings were unchanged.  There was no local bleeding or disruption of the tissue.    With the bladder emptied I attached the mesh sling with the described technique and brought it up through the retropubic space.  I tensioned it over the fat part of a moderate size Kelly clamp.  It laid in very nicely.  It was in the mid urethra.  It laid flat.  There was appropriate hypermobility and no spring back affect.  I was very pleased with the tensioning especially relative to her mild incontinence.  The Foley catheter had been gently placed once again and it was well lubricated.  Incision was irrigated.  I closed the anterior vaginal wall with running 2-0 Vicryl on CT1 needle followed by my 2 interrupted sutures.  I cut the mesh below the level of the skin in the suprapubic area and closed each short incision with interrupted 4-0 Vicryl x2.  Dermabond was utilized.  Vaginal pack was in place.  Patient will be begin trial of voiding with catheter in place with catheter plug.  Blood loss was less than 50 mL.  I was very pleased with the surgery.  There is no local trauma to urethra.  Urine was always clear with no bleeding.  Hopefully the surgery reaches the patient's treatment goal

## 2020-12-16 NOTE — Transfer of Care (Signed)
Immediate Anesthesia Transfer of Care Note  Patient: Tanya Miller  Procedure(s) Performed: CYSTOSCOPY PUBO-VAGINAL SLING (Vagina )  Patient Location: PACU  Anesthesia Type:General  Level of Consciousness: drowsy  Airway & Oxygen Therapy: Patient Spontanous Breathing and Patient connected to face mask oxygen  Post-op Assessment: Report given to RN and Post -op Vital signs reviewed and stable  Post vital signs: Reviewed and stable  Last Vitals:  Vitals Value Taken Time  BP    Temp    Pulse 71 12/16/20 0852  Resp 16 12/16/20 0852  SpO2 96 % 12/16/20 0852  Vitals shown include unvalidated device data.  Last Pain:  Vitals:   12/16/20 0601  TempSrc: Oral  PainSc: 0-No pain      Patients Stated Pain Goal: 6 (12/16/20 0601)  Complications: No notable events documented.

## 2020-12-16 NOTE — Discharge Instructions (Addendum)
I have reviewed discharge instructions in detail with the patient. They will follow-up with me or their physician as scheduled. My nurse will also be calling the patients as per protocol.    Call your surgeon if you experience:   1.  Fever over 101.0. 2.  Inability to urinate. 3.  Nausea and/or vomiting. 4.  Extreme swelling or bruising at the surgical site. 5.  Continued bleeding from the incision. 6.  Increased pain, redness or drainage from the incision. 7.  Problems related to your pain medication. 8.  Any problems and/or concerns    Post Anesthesia Home Care Instructions  Activity: Get plenty of rest for the remainder of the day. A responsible individual must stay with you for 24 hours following the procedure.  For the next 24 hours, DO NOT: -Drive a car -Advertising copywriter -Drink alcoholic beverages -Take any medication unless instructed by your physician -Make any legal decisions or sign important papers.  Meals: Start with liquid foods such as gelatin or soup. Progress to regular foods as tolerated. Avoid greasy, spicy, heavy foods. If nausea and/or vomiting occur, drink only clear liquids until the nausea and/or vomiting subsides. Call your physician if vomiting continues.  Special Instructions/Symptoms: Your throat may feel dry or sore from the anesthesia or the breathing tube placed in your throat during surgery. If this causes discomfort, gargle with warm salt water. The discomfort should disappear within 24 hours.

## 2020-12-16 NOTE — Anesthesia Postprocedure Evaluation (Signed)
Anesthesia Post Note  Patient: Tanya Miller  Procedure(s) Performed: CYSTOSCOPY PUBO-VAGINAL SLING (Vagina )     Patient location during evaluation: PACU Anesthesia Type: General Level of consciousness: awake and alert Pain management: pain level controlled Vital Signs Assessment: post-procedure vital signs reviewed and stable Respiratory status: spontaneous breathing, nonlabored ventilation and respiratory function stable Cardiovascular status: stable and blood pressure returned to baseline Anesthetic complications: no   No notable events documented.  Last Vitals:  Vitals:   12/16/20 0930 12/16/20 0945  BP: (!) 97/59 103/62  Pulse: 74 79  Resp: 14 17  Temp:  36.7 C  SpO2: 96% 97%    Last Pain:  Vitals:   12/16/20 0945  TempSrc:   PainSc: 0-No pain                 Beryle Lathe

## 2020-12-16 NOTE — Anesthesia Procedure Notes (Signed)
Procedure Name: LMA Insertion Date/Time: 12/16/2020 7:31 AM Performed by: Caren Macadam, CRNA Pre-anesthesia Checklist: Patient identified, Emergency Drugs available, Suction available and Patient being monitored Patient Re-evaluated:Patient Re-evaluated prior to induction Oxygen Delivery Method: Circle system utilized Preoxygenation: Pre-oxygenation with 100% oxygen Induction Type: IV induction Ventilation: Mask ventilation without difficulty LMA: LMA inserted LMA Size: 4.0 Number of attempts: 1 Airway Equipment and Method: Bite block Placement Confirmation: positive ETCO2 and breath sounds checked- equal and bilateral Tube secured with: Tape Dental Injury: Teeth and Oropharynx as per pre-operative assessment

## 2020-12-17 ENCOUNTER — Encounter (HOSPITAL_BASED_OUTPATIENT_CLINIC_OR_DEPARTMENT_OTHER): Payer: Self-pay | Admitting: Urology

## 2022-12-29 IMAGING — US US ABDOMEN COMPLETE
1 series · 13 of 25 positions shown · non-contrast
Comparison: None.

CLINICAL DATA: Epigastric and right upper quadrant pain with nausea
and vomiting

EXAM:
ABDOMEN ULTRASOUND COMPLETE

[Series 1: us abdomen complete · 0.30mm/px · 13 of 84 slices shown]
[im 1/84]
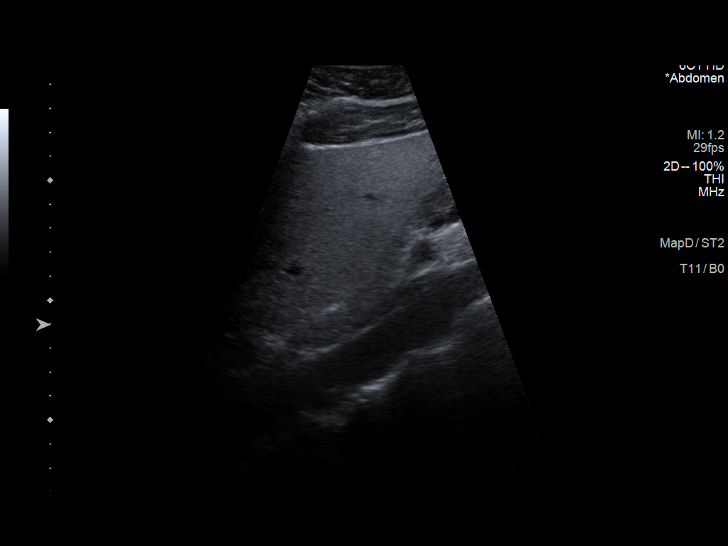
[im 7/84]
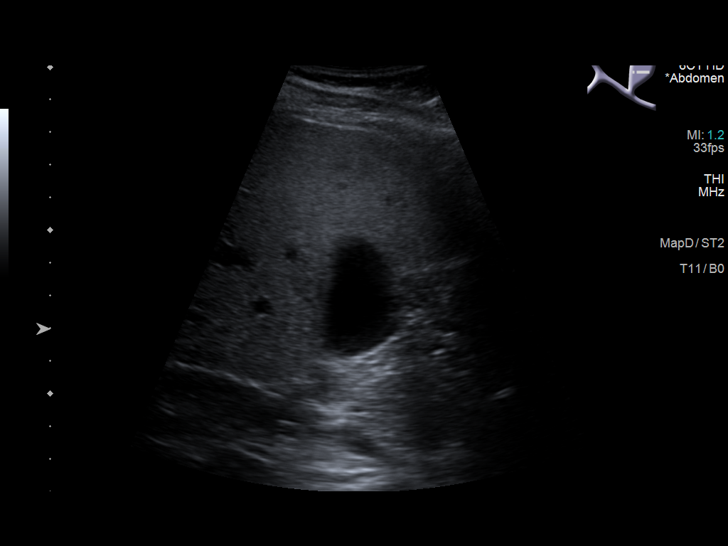
[im 14/84]
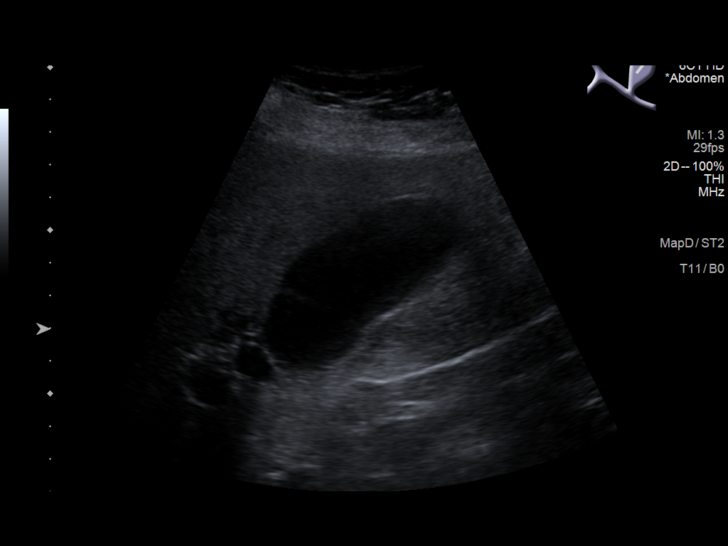
[im 21/84]
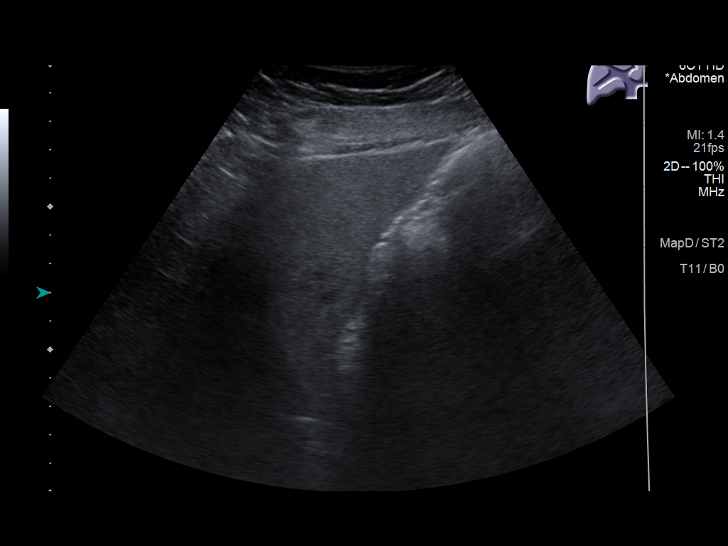
[im 28/84]
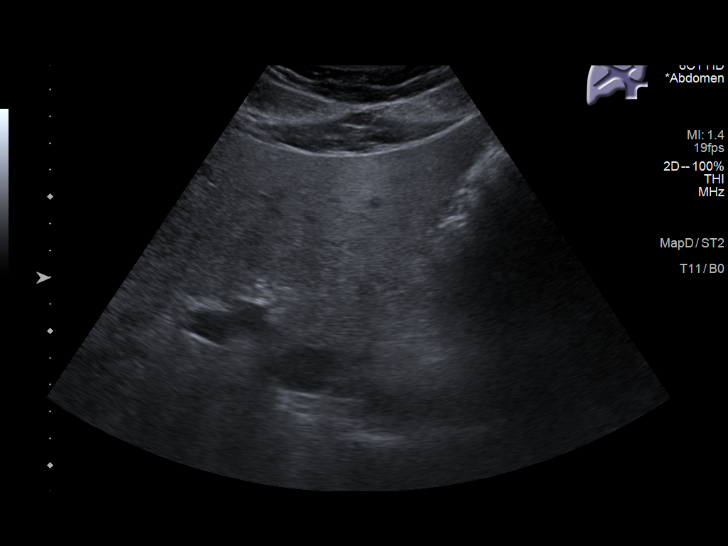
[im 35/84]
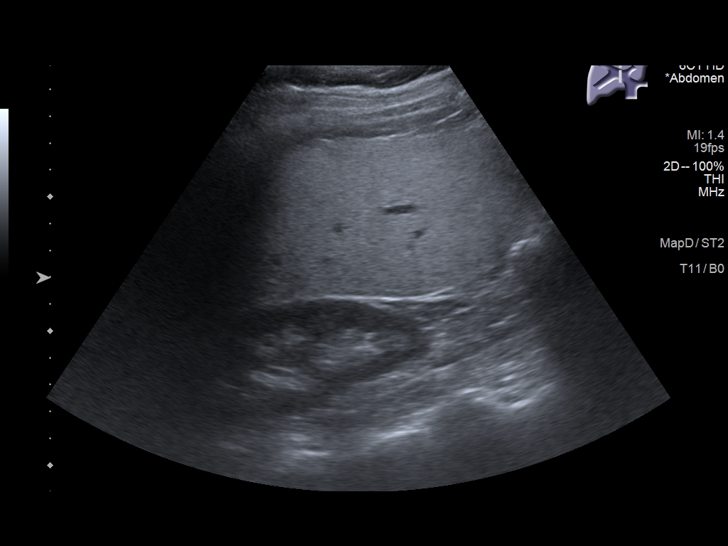
[im 42/84]
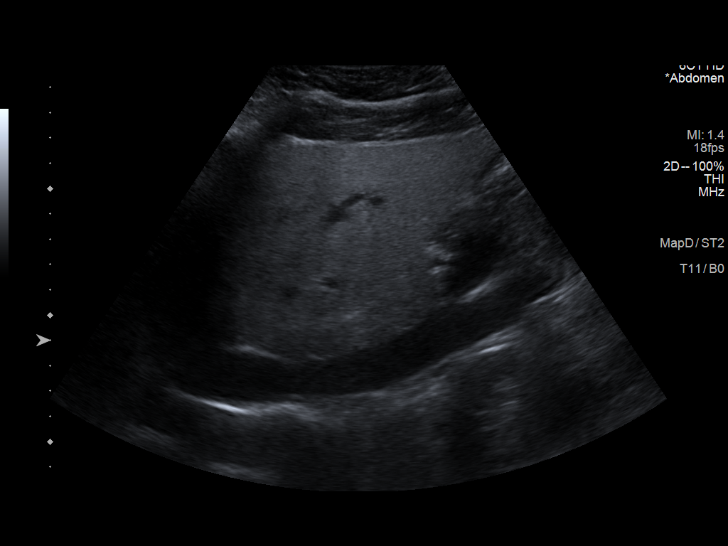
[im 49/84]
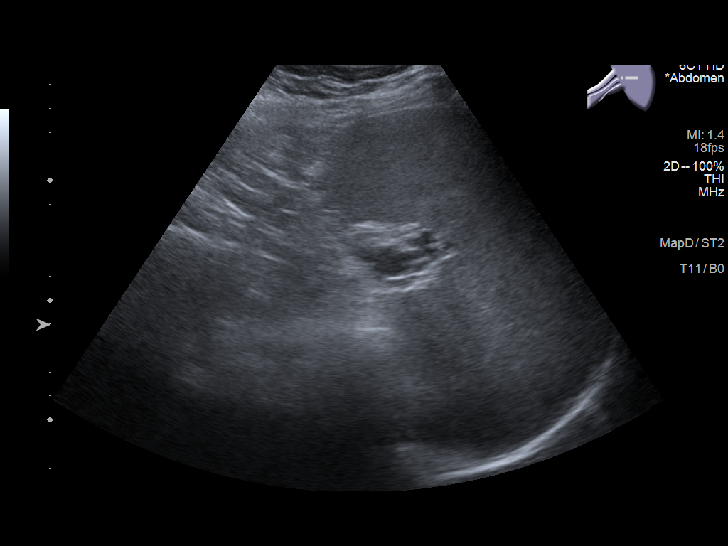
[im 56/84]
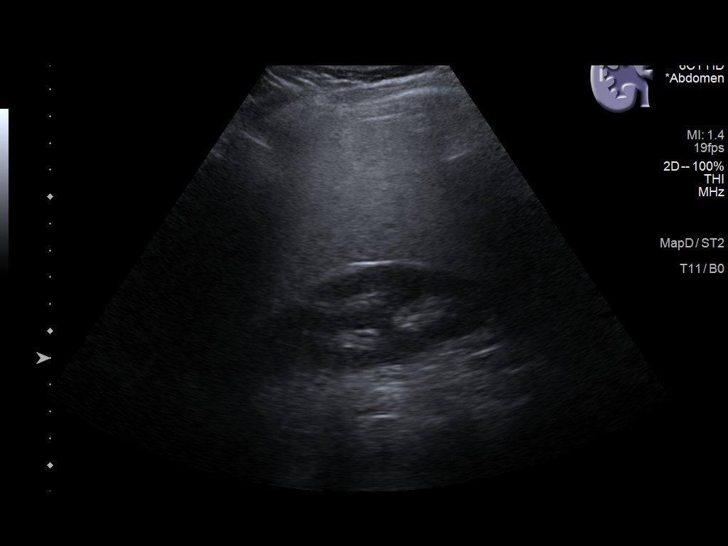
[im 63/84]
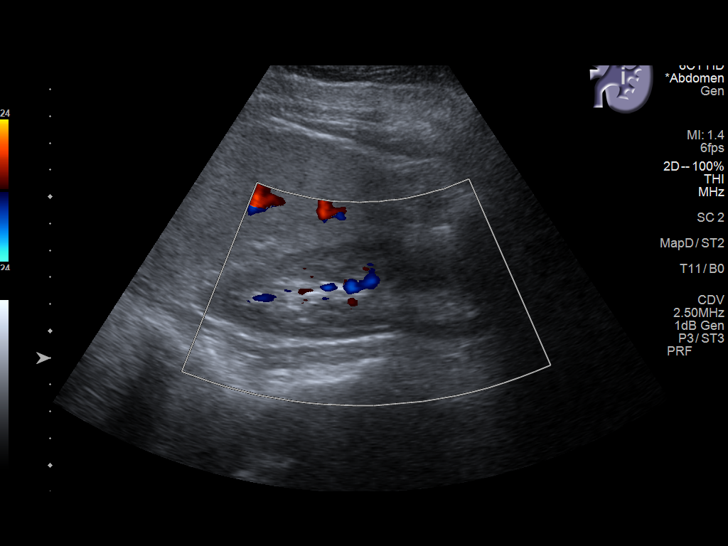
[im 70/84]
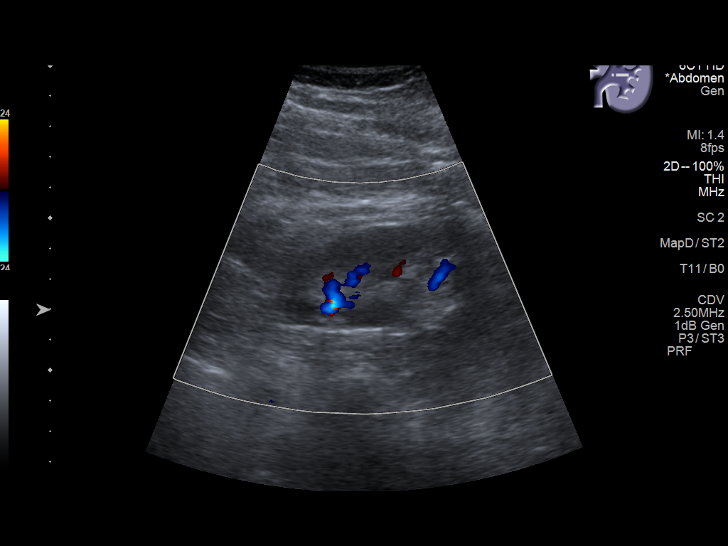
[im 77/84]
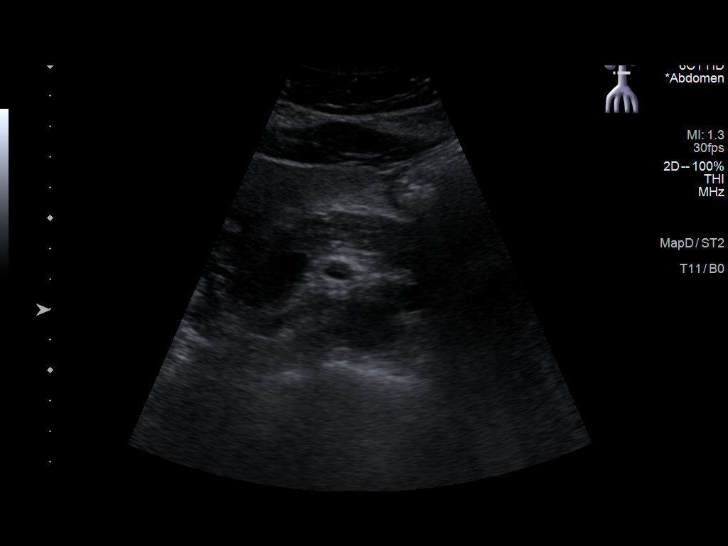
[im 84/84]
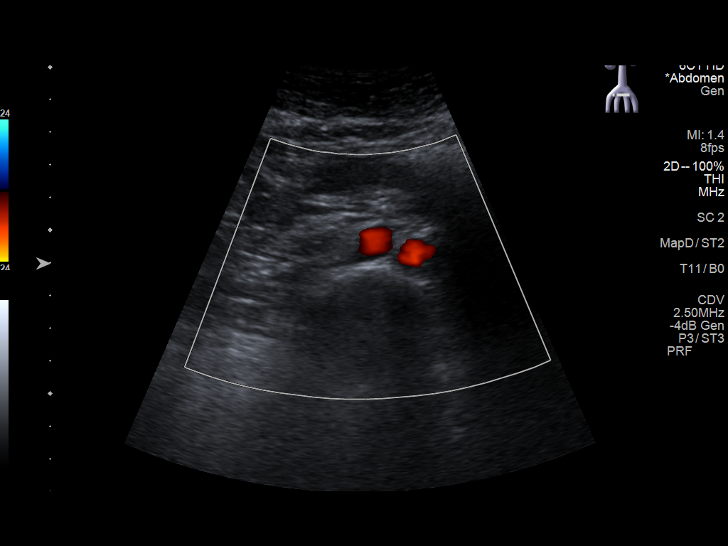

[13 of 25 positions shown; findings below may reference images not displayed]

FINDINGS: Gallbladder: No gallstones or wall thickening visualized. No
sonographic Murphy sign noted by sonographer.

Common bile duct: Diameter: 3 mm

Liver:

No focal lesion.

Diffusely increased parenchymal echogenicity.

Portal vein is patent on color Doppler imaging with normal direction
of blood flow towards the liver.

IVC: No abnormality visualized.

Pancreas: Visualized portion unremarkable.

Spleen: The spleen is enlarged with maximum dimension measuring
cm in volume measuring 5777 cubic cm.

Right Kidney: Length: 10.3 cm. Echogenicity within normal limits. No
mass or hydronephrosis visualized.

Left Kidney: Length: 11.5 cm. Echogenicity within normal limits. No
mass or hydronephrosis visualized.

Abdominal aorta: No aneurysm visualized.

Other findings: None.
IMPRESSION: 1. Diffuse increased echogenicity of the hepatic parenchyma is a
nonspecific indicator of hepatocellular dysfunction, most commonly
steatosis.
2. Splenomegaly. Please correlate with laboratory workup for
possible lymphoma or leukemia.
3. These results will be called to the ordering clinician or
representative by the Radiologist Assistant, and communication
documented in the PACS or [REDACTED].

## 2023-01-18 IMAGING — CT CT ABD-PELV W/ CM
1 of 3 series · 14 of 32 positions shown, 19 images · IV contrast (APPLIED)
Comparison: Abdominal ultrasound dated 03/21/2020.

CLINICAL DATA: 44-year-old female with upper abdominal pain.

EXAM:
CT ABDOMEN AND PELVIS WITH CONTRAST
TECHNIQUE: Multidetector CT imaging of the abdomen and pelvis was performed
using the standard protocol following bolus administration of
intravenous contrast.
CONTRAST:  100mL OMNIPAQUE IOHEXOL 300 MG/ML  SOLN

[Series 2: axial st · axial · 0.74mm/px · z∈[-1126,-676]mm · 14 of 102 slices shown, 19 images]
[im 6/102  soft-tissue]
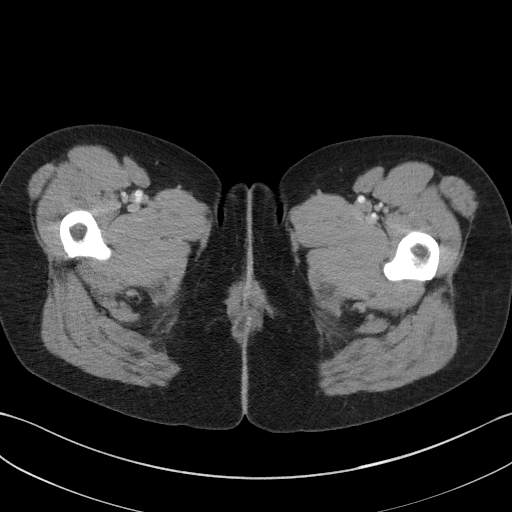
[im 6/102  bone]
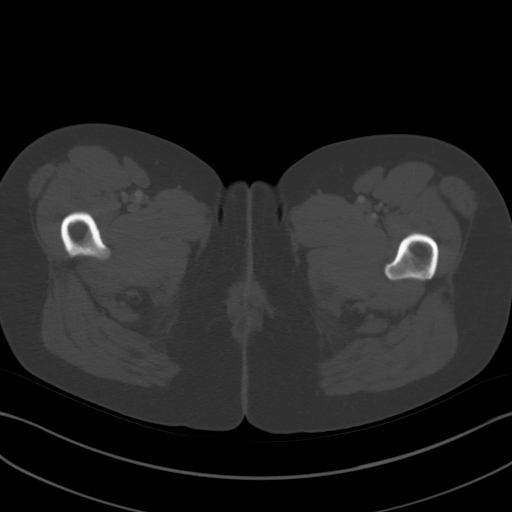
[im 16/102  soft-tissue]
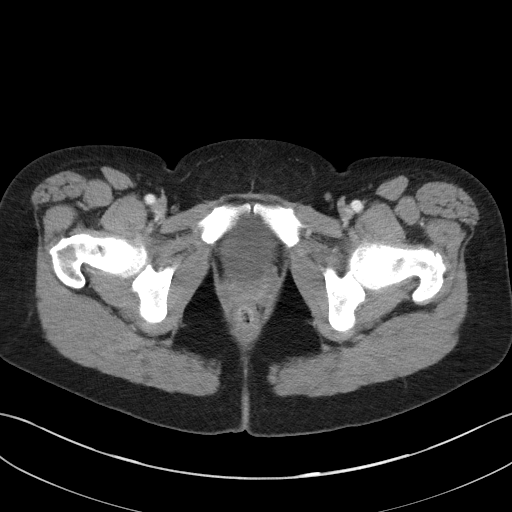
[im 22/102  soft-tissue]
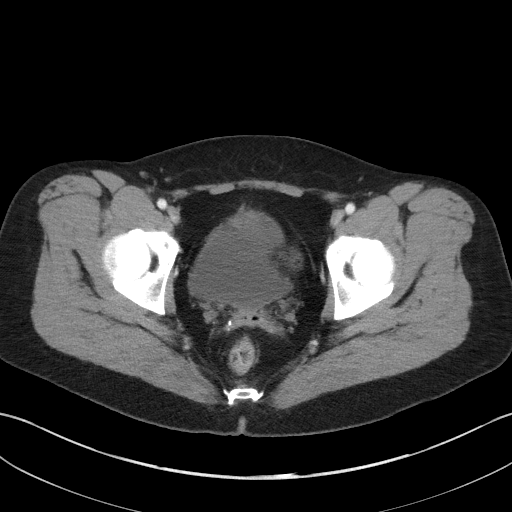
[im 27/102  soft-tissue]
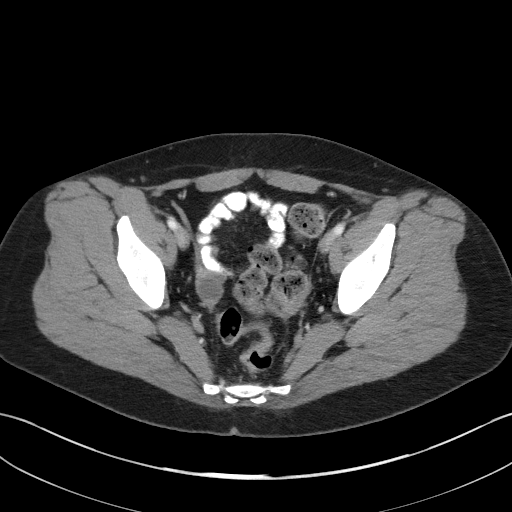
[im 38/102  soft-tissue]
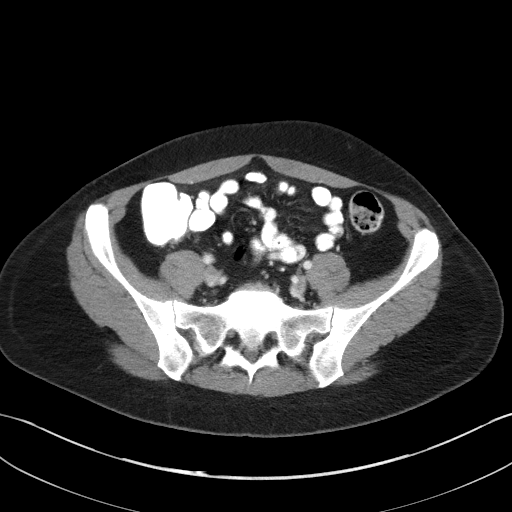
[im 43/102  soft-tissue]
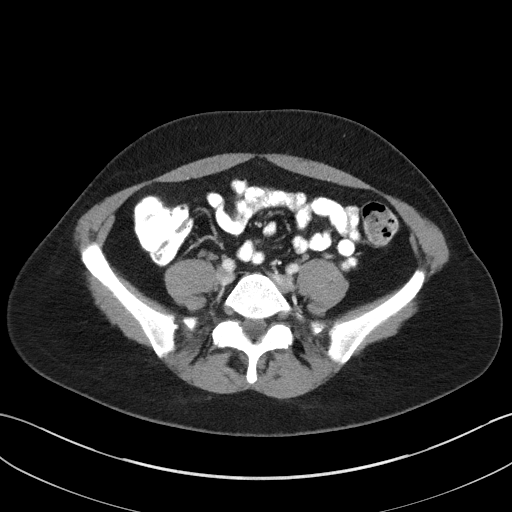
[im 54/102  soft-tissue]
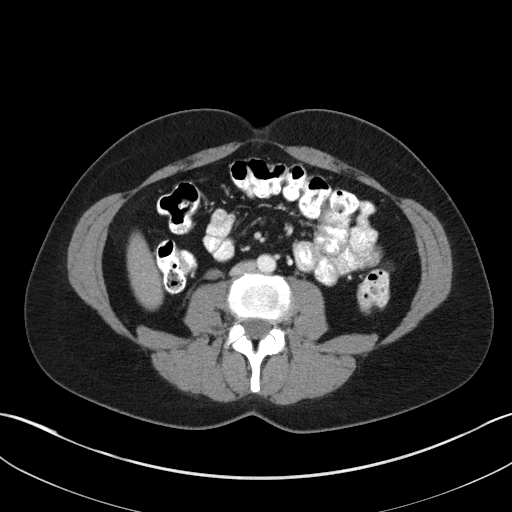
[im 59/102  soft-tissue]
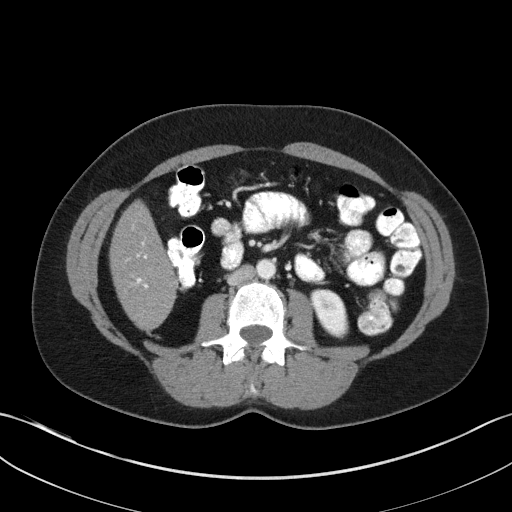
[im 64/102  soft-tissue]
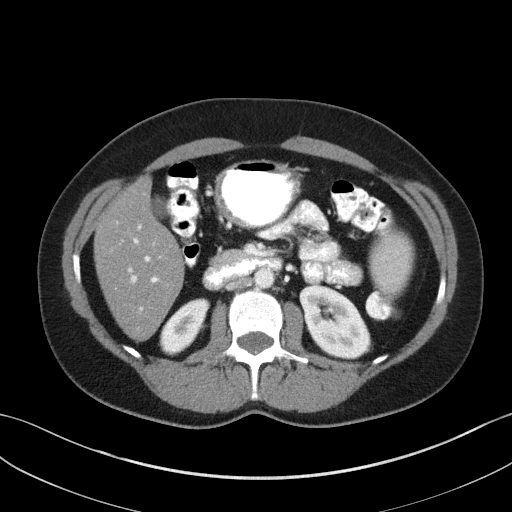
[im 64/102  bone]
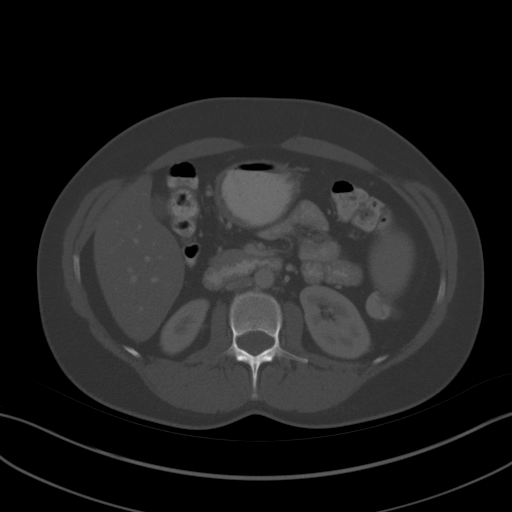
[im 75/102  soft-tissue]
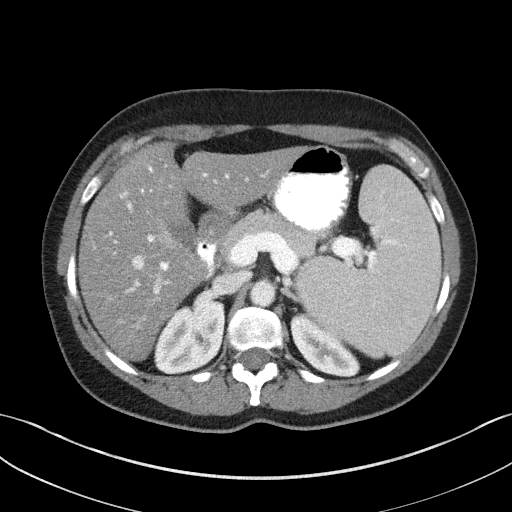
[im 80/102  soft-tissue]
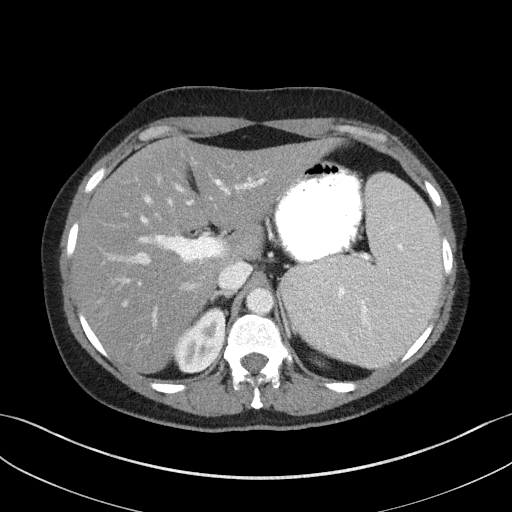
[im 80/102  lung]
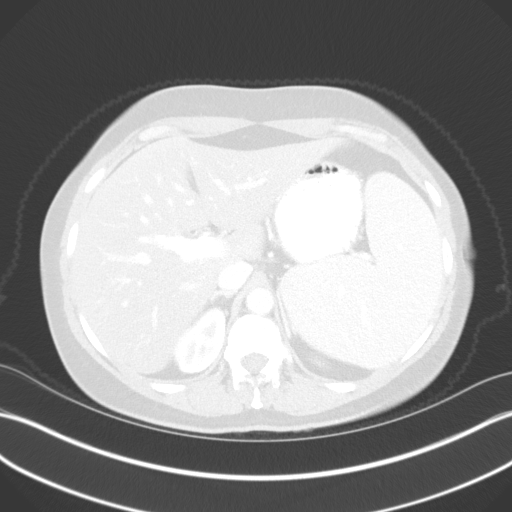
[im 86/102  soft-tissue]
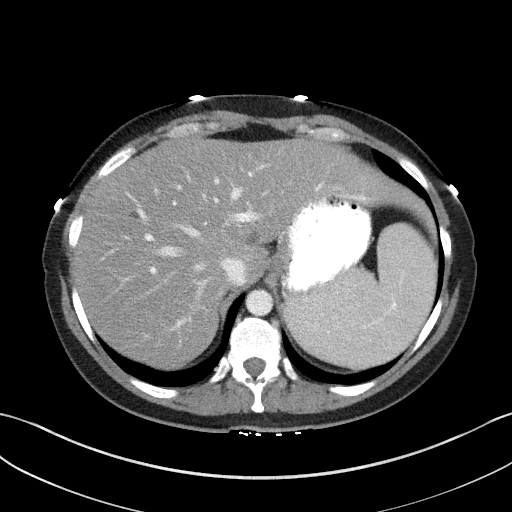
[im 86/102  lung]
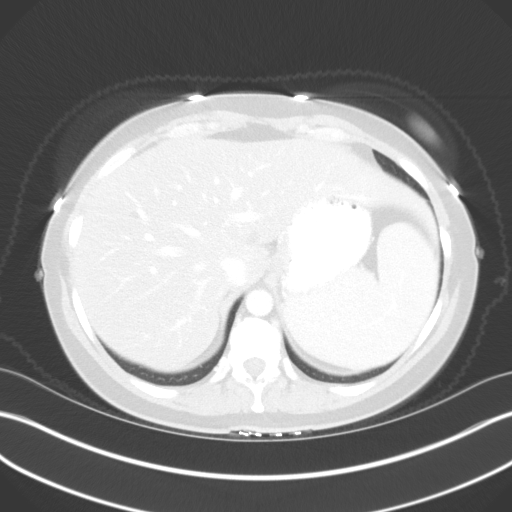
[im 91/102  lung]
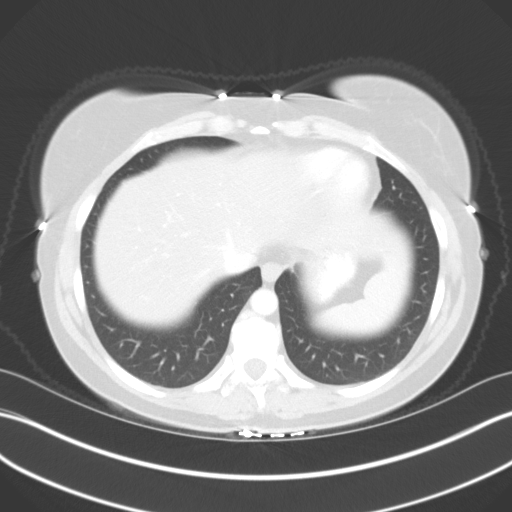
[im 96/102  soft-tissue]
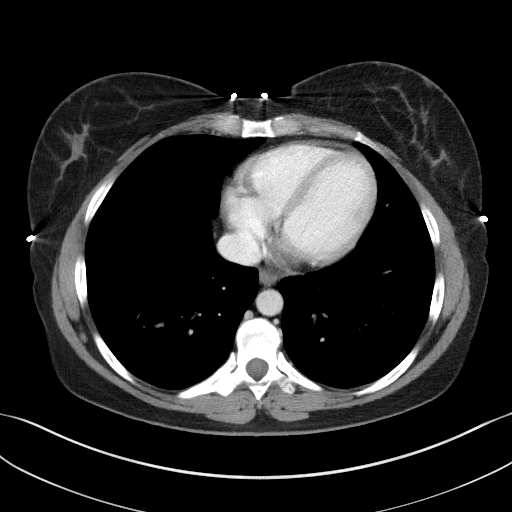
[im 96/102  lung]
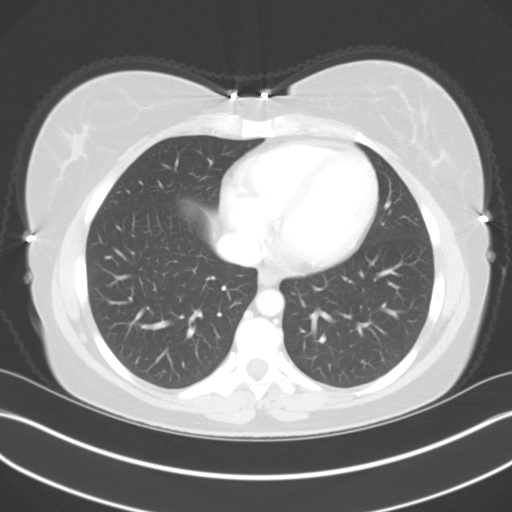

[14 of 32 positions shown; findings below may reference images not displayed]

FINDINGS: Lower chest: The visualized lung bases are clear.

No intra-abdominal free air or free fluid.

Hepatobiliary: Apparent fatty liver. No intrahepatic biliary ductal
dilatation. The gallbladder is unremarkable.

Pancreas: Unremarkable. No pancreatic ductal dilatation or
surrounding inflammatory changes.

Spleen: The spleen is mildly enlarged measuring up to 14 cm in
length.

Adrenals/Urinary Tract: The adrenal glands unremarkable. Several
nonobstructing right renal calculi measure up to 2-3 mm. No
hydronephrosis. The left kidney is unremarkable. The visualized
ureters and urinary bladder appear unremarkable.

Stomach/Bowel: There is no bowel obstruction or active inflammation.
The appendix is normal.

Vascular/Lymphatic: The abdominal aorta and IVC are unremarkable. No
portal venous gas. There is no adenopathy.

Reproductive: Hysterectomy.  No adnexal masses.

Other: None

Musculoskeletal: No acute or significant osseous findings.
IMPRESSION: 1. No acute intra-abdominal or pelvic pathology. No bowel
obstruction. Normal appendix.
2. Small nonobstructing right renal calculi. No hydronephrosis.
3. Fatty liver.
4. Mild splenomegaly.
